# Patient Record
Sex: Female | Born: 1955 | Race: Black or African American | Hispanic: No | Marital: Single | Smoking: Former smoker
Health system: Southern US, Community
[De-identification: ages and names within clinical notes are randomized; demographics above are authoritative.]

## PROBLEM LIST (undated history)

## (undated) DIAGNOSIS — I10 Essential (primary) hypertension: Secondary | ICD-10-CM

## (undated) DIAGNOSIS — F419 Anxiety disorder, unspecified: Secondary | ICD-10-CM

## (undated) DIAGNOSIS — E785 Hyperlipidemia, unspecified: Secondary | ICD-10-CM

## (undated) DIAGNOSIS — E876 Hypokalemia: Secondary | ICD-10-CM

## (undated) DIAGNOSIS — E119 Type 2 diabetes mellitus without complications: Secondary | ICD-10-CM

## (undated) DIAGNOSIS — I4891 Unspecified atrial fibrillation: Secondary | ICD-10-CM

## (undated) DIAGNOSIS — D649 Anemia, unspecified: Secondary | ICD-10-CM

## (undated) DIAGNOSIS — R011 Cardiac murmur, unspecified: Secondary | ICD-10-CM

## (undated) DIAGNOSIS — E039 Hypothyroidism, unspecified: Secondary | ICD-10-CM

## (undated) HISTORY — DX: Hyperlipidemia, unspecified: E78.5

## (undated) HISTORY — DX: Hypothyroidism, unspecified: E03.9

## (undated) HISTORY — DX: Unspecified atrial fibrillation: I48.91

## (undated) HISTORY — DX: Anemia, unspecified: D64.9

## (undated) HISTORY — DX: Essential (primary) hypertension: I10

## (undated) HISTORY — DX: Type 2 diabetes mellitus without complications: E11.9

## (undated) HISTORY — PX: EYE SURGERY: SHX253

---

## 2011-01-11 ENCOUNTER — Other Ambulatory Visit: Payer: Self-pay | Admitting: Family Medicine

## 2011-01-11 DIAGNOSIS — N6320 Unspecified lump in the left breast, unspecified quadrant: Secondary | ICD-10-CM

## 2011-01-19 ENCOUNTER — Ambulatory Visit
Admission: RE | Admit: 2011-01-19 | Discharge: 2011-01-19 | Disposition: A | Discharge status: Home / Self Care | Payer: BC Managed Care – PPO | Source: Ambulatory Visit | Attending: Family Medicine | Admitting: Family Medicine

## 2011-01-19 ENCOUNTER — Other Ambulatory Visit: Payer: Self-pay | Admitting: Diagnostic Radiology

## 2011-01-19 ENCOUNTER — Other Ambulatory Visit: Payer: Self-pay | Admitting: Family Medicine

## 2011-01-19 DIAGNOSIS — N6320 Unspecified lump in the left breast, unspecified quadrant: Secondary | ICD-10-CM

## 2011-02-15 ENCOUNTER — Emergency Department (HOSPITAL_COMMUNITY)
Admission: EM | Admit: 2011-02-15 | Discharge: 2011-02-15 | Disposition: A | Discharge status: Home / Self Care | Payer: No Typology Code available for payment source | Attending: Emergency Medicine | Admitting: Emergency Medicine

## 2011-02-15 DIAGNOSIS — E119 Type 2 diabetes mellitus without complications: Secondary | ICD-10-CM | POA: Insufficient documentation

## 2011-02-15 DIAGNOSIS — S335XXA Sprain of ligaments of lumbar spine, initial encounter: Secondary | ICD-10-CM | POA: Insufficient documentation

## 2011-02-15 DIAGNOSIS — Z79899 Other long term (current) drug therapy: Secondary | ICD-10-CM | POA: Insufficient documentation

## 2011-02-15 DIAGNOSIS — I10 Essential (primary) hypertension: Secondary | ICD-10-CM | POA: Insufficient documentation

## 2011-02-15 DIAGNOSIS — R51 Headache: Secondary | ICD-10-CM | POA: Insufficient documentation

## 2011-07-11 ENCOUNTER — Encounter: Payer: Self-pay | Admitting: Cardiology

## 2011-12-29 ENCOUNTER — Emergency Department (HOSPITAL_COMMUNITY): Payer: BC Managed Care – PPO

## 2011-12-29 ENCOUNTER — Encounter (HOSPITAL_COMMUNITY): Payer: Self-pay | Admitting: *Deleted

## 2011-12-29 ENCOUNTER — Observation Stay (HOSPITAL_COMMUNITY)
Admission: EM | Admit: 2011-12-29 | Discharge: 2011-12-30 | DRG: 139 | Disposition: A | Discharge status: Home / Self Care | Payer: BC Managed Care – PPO | Attending: Internal Medicine | Admitting: Internal Medicine

## 2011-12-29 DIAGNOSIS — E119 Type 2 diabetes mellitus without complications: Secondary | ICD-10-CM

## 2011-12-29 DIAGNOSIS — E032 Hypothyroidism due to medicaments and other exogenous substances: Secondary | ICD-10-CM

## 2011-12-29 DIAGNOSIS — E7800 Pure hypercholesterolemia, unspecified: Secondary | ICD-10-CM | POA: Diagnosis present

## 2011-12-29 DIAGNOSIS — I480 Paroxysmal atrial fibrillation: Secondary | ICD-10-CM | POA: Diagnosis present

## 2011-12-29 DIAGNOSIS — Z794 Long term (current) use of insulin: Secondary | ICD-10-CM

## 2011-12-29 DIAGNOSIS — I4891 Unspecified atrial fibrillation: Principal | ICD-10-CM | POA: Diagnosis present

## 2011-12-29 DIAGNOSIS — I10 Essential (primary) hypertension: Secondary | ICD-10-CM | POA: Diagnosis present

## 2011-12-29 DIAGNOSIS — Z23 Encounter for immunization: Secondary | ICD-10-CM

## 2011-12-29 DIAGNOSIS — E039 Hypothyroidism, unspecified: Secondary | ICD-10-CM | POA: Diagnosis present

## 2011-12-29 DIAGNOSIS — Z7982 Long term (current) use of aspirin: Secondary | ICD-10-CM

## 2011-12-29 DIAGNOSIS — Z79899 Other long term (current) drug therapy: Secondary | ICD-10-CM

## 2011-12-29 DIAGNOSIS — Z87891 Personal history of nicotine dependence: Secondary | ICD-10-CM

## 2011-12-29 DIAGNOSIS — E785 Hyperlipidemia, unspecified: Secondary | ICD-10-CM | POA: Diagnosis present

## 2011-12-29 DIAGNOSIS — F411 Generalized anxiety disorder: Secondary | ICD-10-CM | POA: Diagnosis present

## 2011-12-29 DIAGNOSIS — R011 Cardiac murmur, unspecified: Secondary | ICD-10-CM | POA: Diagnosis present

## 2011-12-29 HISTORY — DX: Cardiac murmur, unspecified: R01.1

## 2011-12-29 HISTORY — DX: Anxiety disorder, unspecified: F41.9

## 2011-12-29 HISTORY — DX: Hypokalemia: E87.6

## 2011-12-29 LAB — HEMOGLOBIN A1C
Hgb A1c MFr Bld: 6.5 % — ABNORMAL HIGH (ref <5.7)
Mean Plasma Glucose: 140 mg/dL — ABNORMAL HIGH (ref <117)

## 2011-12-29 LAB — BASIC METABOLIC PANEL
BUN: 10 mg/dL (ref 6–23)
CO2: 24 mEq/L (ref 19–32)
Calcium: 9.6 mg/dL (ref 8.4–10.5)
Chloride: 105 mEq/L (ref 96–112)
Creatinine, Ser: 0.66 mg/dL (ref 0.50–1.10)
GFR calc Af Amer: >90 mL/min (ref >90)
GFR calc non Af Amer: >90 mL/min (ref >90)
Glucose, Bld: 228 mg/dL — ABNORMAL HIGH (ref 70–99)
Potassium: 4.2 mEq/L (ref 3.5–5.1)
Sodium: 142 mEq/L (ref 135–145)

## 2011-12-29 LAB — POCT I-STAT, CHEM 8
BUN: 10 mg/dL (ref 6–23)
Calcium, Ion: 1.23 mmol/L (ref 1.12–1.32)
Chloride: 101 mEq/L (ref 96–112)
Creatinine, Ser: 0.7 mg/dL (ref 0.50–1.10)
Glucose, Bld: 189 mg/dL — ABNORMAL HIGH (ref 70–99)
HCT: 44 % (ref 36.0–46.0)
Hemoglobin: 15 g/dL (ref 12.0–15.0)
Potassium: 3.3 mEq/L — ABNORMAL LOW (ref 3.5–5.1)
Sodium: 143 mEq/L (ref 135–145)
TCO2: 25 mmol/L (ref 0–100)

## 2011-12-29 LAB — GLUCOSE, CAPILLARY
Glucose-Capillary: 222 mg/dL — ABNORMAL HIGH (ref 70–99)
Glucose-Capillary: 182 mg/dL — ABNORMAL HIGH (ref 70–99)
Glucose-Capillary: 164 mg/dL — ABNORMAL HIGH (ref 70–99)
Glucose-Capillary: 115 mg/dL — ABNORMAL HIGH (ref 70–99)
Glucose-Capillary: 140 mg/dL — ABNORMAL HIGH (ref 70–99)

## 2011-12-29 LAB — POCT I-STAT TROPONIN I: Troponin i, poc: 0 ng/mL (ref 0.00–0.08)

## 2011-12-29 LAB — CBC
HCT: 40.3 % (ref 36.0–46.0)
Hemoglobin: 13.2 g/dL (ref 12.0–15.0)
MCH: 27.2 pg (ref 26.0–34.0)
MCHC: 32.8 g/dL (ref 30.0–36.0)
MCV: 82.9 fL (ref 78.0–100.0)
Platelets: 267 10*3/uL (ref 150–400)
RBC: 4.86 MIL/uL (ref 3.87–5.11)
RDW: 15.2 % (ref 11.5–15.5)
WBC: 7.6 10*3/uL (ref 4.0–10.5)

## 2011-12-29 LAB — PROTIME-INR
INR: 0.83 (ref 0.00–1.49)
Prothrombin Time: 11.6 seconds (ref 11.6–15.2)

## 2011-12-29 LAB — LIPID PANEL
Cholesterol: 115 mg/dL (ref 0–200)
HDL: 33 mg/dL — ABNORMAL LOW (ref >39)
LDL Cholesterol: 58 mg/dL (ref 0–99)
Total CHOL/HDL Ratio: 3.5 RATIO
Triglycerides: 118 mg/dL (ref <150)
VLDL: 24 mg/dL (ref 0–40)

## 2011-12-29 LAB — APTT: aPTT: 31 seconds (ref 24–37)

## 2011-12-29 LAB — CARDIAC PANEL(CRET KIN+CKTOT+MB+TROPI)
CK, MB: 1.7 ng/mL (ref 0.3–4.0)
Relative Index: INVALID (ref 0.0–2.5)
Total CK: 79 U/L (ref 7–177)
Troponin I: <0.3 ng/mL (ref <0.30)

## 2011-12-29 LAB — MRSA PCR SCREENING: MRSA by PCR: NEGATIVE

## 2011-12-29 LAB — TSH: TSH: 5.833 u[IU]/mL — ABNORMAL HIGH (ref 0.350–4.500)

## 2011-12-29 MED ORDER — METOPROLOL SUCCINATE ER 100 MG PO TB24
200.0000 mg | ORAL_TABLET | Freq: Every day | ORAL | Status: DC
Start: 2011-12-29 — End: 2011-12-30
  Administered 2011-12-29 – 2011-12-30 (×2): 200 mg via ORAL
  Filled 2011-12-29 (×2): qty 2

## 2011-12-29 MED ORDER — INSULIN ASPART 100 UNIT/ML ~~LOC~~ SOLN: 20.0000 [IU] | Freq: Two times a day (BID) | SUBCUTANEOUS | Status: DC

## 2011-12-29 MED ORDER — DILTIAZEM HCL 50 MG/10ML IV SOLN: 10.0000 mg | Freq: Once | INTRAVENOUS | Status: DC

## 2011-12-29 MED ORDER — OLMESARTAN-AMLODIPINE-HCTZ 40-5-12.5 MG PO TABS
1.0000 | ORAL_TABLET | ORAL | Status: DC
  Filled 2011-12-29 (×2): qty 1

## 2011-12-29 MED ORDER — DILTIAZEM HCL ER COATED BEADS 240 MG PO CP24
240.0000 mg | ORAL_CAPSULE | Freq: Every day | ORAL | Status: DC
  Administered 2011-12-30: 240 mg via ORAL
  Filled 2011-12-29: qty 1

## 2011-12-29 MED ORDER — DILTIAZEM LOAD VIA INFUSION
10.0000 mg | Freq: Once | INTRAVENOUS | Status: AC
  Administered 2011-12-29: 10 mg via INTRAVENOUS
  Filled 2011-12-29: qty 10

## 2011-12-29 MED ORDER — PNEUMOCOCCAL VAC POLYVALENT 25 MCG/0.5ML IJ INJ
0.5000 mL | INJECTION | INTRAMUSCULAR | Status: AC
  Administered 2011-12-30: 0.5 mL via INTRAMUSCULAR
  Filled 2011-12-29: qty 0.5

## 2011-12-29 MED ORDER — LEVOTHYROXINE SODIUM 50 MCG PO TABS
50.0000 ug | ORAL_TABLET | Freq: Every day | ORAL | Status: DC
  Administered 2011-12-29 – 2011-12-30 (×2): 50 ug via ORAL
  Filled 2011-12-29 (×2): qty 1

## 2011-12-29 MED ORDER — DILTIAZEM HCL 100 MG IV SOLR: 5.0000 mg/h | INTRAVENOUS | Status: DC

## 2011-12-29 MED ORDER — MAGNESIUM SULFATE IN D5W 10-5 MG/ML-% IV SOLN
1.0000 g | Freq: Once | INTRAVENOUS | Status: AC
  Administered 2011-12-29: 1 g via INTRAVENOUS
  Filled 2011-12-29 (×2): qty 100

## 2011-12-29 MED ORDER — MAGNESIUM SULFATE 50 % IJ SOLN: 1.0000 g | Freq: Once | INTRAMUSCULAR | Status: DC

## 2011-12-29 MED ORDER — INSULIN ASPART PROT & ASPART (70-30 MIX) 100 UNIT/ML ~~LOC~~ SUSP: 12.0000 [IU] | Freq: Two times a day (BID) | SUBCUTANEOUS | Status: DC

## 2011-12-29 MED ORDER — HYDRALAZINE HCL 50 MG PO TABS
100.0000 mg | ORAL_TABLET | Freq: Two times a day (BID) | ORAL | Status: DC
Start: 2011-12-29 — End: 2011-12-30
  Administered 2011-12-29 – 2011-12-30 (×3): 100 mg via ORAL
  Filled 2011-12-29 (×4): qty 2

## 2011-12-29 MED ORDER — SODIUM CHLORIDE 0.9 % IV SOLN: INTRAVENOUS | Status: DC

## 2011-12-29 MED ORDER — POLYETHYLENE GLYCOL 3350 17 G PO PACK
17.0000 g | PACK | Freq: Every day | ORAL | Status: DC
  Filled 2011-12-29 (×2): qty 1

## 2011-12-29 MED ORDER — INSULIN ASPART 100 UNIT/ML ~~LOC~~ SOLN
0.0000 [IU] | Freq: Three times a day (TID) | SUBCUTANEOUS | Status: DC
  Administered 2011-12-29 – 2011-12-30 (×3): 3 [IU] via SUBCUTANEOUS
  Administered 2011-12-30: 5 [IU] via SUBCUTANEOUS
  Administered 2011-12-30: 3 [IU] via SUBCUTANEOUS

## 2011-12-29 MED ORDER — ACETAMINOPHEN 650 MG RE SUPP: 650.0000 mg | Freq: Four times a day (QID) | RECTAL | Status: DC | PRN

## 2011-12-29 MED ORDER — DILTIAZEM HCL 100 MG IV SOLR
5.0000 mg/h | INTRAVENOUS | Status: DC
  Administered 2011-12-29: 5 mg/h via INTRAVENOUS
  Filled 2011-12-29: qty 100

## 2011-12-29 MED ORDER — DILTIAZEM HCL ER COATED BEADS 120 MG PO CP24
120.0000 mg | ORAL_CAPSULE | Freq: Every day | ORAL | Status: DC
  Administered 2011-12-29: 120 mg via ORAL
  Filled 2011-12-29: qty 1

## 2011-12-29 MED ORDER — INSULIN ASPART 100 UNIT/ML ~~LOC~~ SOLN: 0.0000 [IU] | Freq: Every day | SUBCUTANEOUS | Status: DC

## 2011-12-29 MED ORDER — SODIUM CHLORIDE 0.9 % IV SOLN
INTRAVENOUS | Status: DC
  Administered 2011-12-29 (×2): via INTRAVENOUS

## 2011-12-29 MED ORDER — INSULIN ASPART 100 UNIT/ML ~~LOC~~ SOLN: 0.0000 [IU] | Freq: Three times a day (TID) | SUBCUTANEOUS | Status: DC

## 2011-12-29 MED ORDER — AMLODIPINE BESYLATE 5 MG PO TABS
5.0000 mg | ORAL_TABLET | Freq: Every day | ORAL | Status: DC
  Filled 2011-12-29: qty 1

## 2011-12-29 MED ORDER — ACETAMINOPHEN 325 MG PO TABS: 650.0000 mg | ORAL_TABLET | Freq: Four times a day (QID) | ORAL | Status: DC | PRN

## 2011-12-29 MED ORDER — ONDANSETRON HCL 4 MG/2ML IJ SOLN: 4.0000 mg | Freq: Four times a day (QID) | INTRAMUSCULAR | Status: DC | PRN

## 2011-12-29 MED ORDER — POTASSIUM CHLORIDE CRYS ER 20 MEQ PO TBCR
40.0000 meq | EXTENDED_RELEASE_TABLET | Freq: Once | ORAL | Status: AC
  Administered 2011-12-29: 40 meq via ORAL
  Filled 2011-12-29: qty 2

## 2011-12-29 MED ORDER — ONDANSETRON HCL 4 MG PO TABS: 4.0000 mg | ORAL_TABLET | Freq: Four times a day (QID) | ORAL | Status: DC | PRN

## 2011-12-29 MED ORDER — OLMESARTAN MEDOXOMIL 40 MG PO TABS
40.0000 mg | ORAL_TABLET | Freq: Every day | ORAL | Status: DC
  Administered 2011-12-29 – 2011-12-30 (×2): 40 mg via ORAL
  Filled 2011-12-29 (×2): qty 1

## 2011-12-29 MED ORDER — SODIUM CHLORIDE 0.9 % IV SOLN
INTRAVENOUS | Status: DC
  Administered 2011-12-29: 07:00:00 via INTRAVENOUS

## 2011-12-29 MED ORDER — DILTIAZEM HCL 100 MG IV SOLR
5.0000 mg/h | INTRAVENOUS | Status: DC
  Filled 2011-12-29: qty 100

## 2011-12-29 MED ORDER — POTASSIUM CHLORIDE CRYS ER 20 MEQ PO TBCR
40.0000 meq | EXTENDED_RELEASE_TABLET | Freq: Once | ORAL | Status: AC
  Administered 2011-12-29: 40 meq via ORAL

## 2011-12-29 MED ORDER — APIXABAN 5 MG PO TABS
5.0000 mg | ORAL_TABLET | Freq: Two times a day (BID) | ORAL | Status: DC
  Administered 2011-12-29 – 2011-12-30 (×3): 5 mg via ORAL
  Filled 2011-12-29 (×5): qty 1

## 2011-12-29 MED ORDER — ZOLPIDEM TARTRATE 5 MG PO TABS: 5.0000 mg | ORAL_TABLET | Freq: Every evening | ORAL | Status: DC | PRN

## 2011-12-29 MED ORDER — ONDANSETRON HCL 4 MG/2ML IJ SOLN: 4.0000 mg | Freq: Three times a day (TID) | INTRAMUSCULAR | Status: AC | PRN

## 2011-12-29 MED ORDER — POTASSIUM CHLORIDE CRYS ER 20 MEQ PO TBCR
EXTENDED_RELEASE_TABLET | ORAL | Status: AC
  Filled 2011-12-29: qty 1

## 2011-12-29 MED ORDER — ASPIRIN EC 81 MG PO TBEC
81.0000 mg | DELAYED_RELEASE_TABLET | Freq: Every day | ORAL | Status: DC
  Filled 2011-12-29: qty 1

## 2011-12-29 MED ORDER — HYDROCHLOROTHIAZIDE 12.5 MG PO CAPS
12.5000 mg | ORAL_CAPSULE | Freq: Every day | ORAL | Status: DC
  Administered 2011-12-29 – 2011-12-30 (×2): 12.5 mg via ORAL
  Filled 2011-12-29 (×2): qty 1

## 2011-12-29 MED ORDER — POTASSIUM CHLORIDE 20 MEQ PO PACK
40.0000 meq | PACK | Freq: Once | ORAL | Status: DC
  Filled 2011-12-29: qty 2

## 2011-12-29 MED ORDER — ENOXAPARIN SODIUM 40 MG/0.4ML ~~LOC~~ SOLN
40.0000 mg | SUBCUTANEOUS | Status: DC
  Administered 2011-12-29: 40 mg via SUBCUTANEOUS
  Filled 2011-12-29: qty 0.4

## 2011-12-29 MED ORDER — FAMOTIDINE 20 MG PO TABS
20.0000 mg | ORAL_TABLET | Freq: Two times a day (BID) | ORAL | Status: DC
  Administered 2011-12-29 – 2011-12-30 (×3): 20 mg via ORAL
  Filled 2011-12-29 (×4): qty 1

## 2011-12-29 MED ORDER — ASPIRIN 81 MG PO CHEW
81.0000 mg | CHEWABLE_TABLET | Freq: Every day | ORAL | Status: DC
  Administered 2011-12-29: 81 mg via ORAL
  Filled 2011-12-29: qty 1

## 2011-12-29 MED ORDER — INSULIN NPH (HUMAN) (ISOPHANE) 100 UNIT/ML ~~LOC~~ SUSP
10.0000 [IU] | Freq: Two times a day (BID) | SUBCUTANEOUS | Status: DC
  Administered 2011-12-29 – 2011-12-30 (×4): 10 [IU] via SUBCUTANEOUS
  Filled 2011-12-29: qty 10

## 2011-12-29 NOTE — Progress Notes (Signed)
[facility]  Pt evaluated. She states she is queasy.  Discussed need for anticoagulation and change of Norvasc to Cardizem. Explained that I will be requesting a formal cardiology eval and an ECHO.   K+ replaced and improved.  Lipids normal except for low HDL.  Cut back on [place] acting Insulin due to decreased PO intake.  Order Free T4. Elevated TSH can be sick euthyroid.   Calvert Cantor, MD [phone]

## 2011-12-29 NOTE — ED Provider Notes (Signed)
History     CSN: [id number]  Arrival date & time 12/29/11  0136   First MD Initiated Contact with Patient 12/29/11 0157      Chief Complaint  Patient presents with  . Palpitations    (Consider location/radiation/quality/duration/timing/severity/associated sxs/prior treatment) HPI Comments: Has a history of similar episode in the past. Is on metoprolol. Was admitted to a hospital in [place] for a similar problem in the past. Primary physician is with [facility] physicians  Patient is a 56 y.o. female presenting with palpitations. The history is provided by the patient. No language interpreter was used.  Palpitations  This is a new problem. The current episode started 3 to 5 hours ago. The problem occurs constantly. The problem has been gradually worsening. The problem is associated with an unknown factor. Associated symptoms include irregular heartbeat. Pertinent negatives include no diaphoresis, no fever, no malaise/fatigue, no numbness, no chest pain, no chest pressure, no exertional chest pressure, no near-syncope, no abdominal pain, no nausea, no vomiting, no headaches, no back pain, no leg pain, no lower extremity edema, no dizziness, no weakness, no cough and no shortness of breath.    Past Medical History  Diagnosis Date  . Hypertension   . Diabetes mellitus   . Hypercholesterolemia   . Thyroid disease   . Anxiety   . Heart murmur   . Hypokalemia     Past Surgical History  Procedure Date  . Eye surgery     History reviewed. No pertinent family history.  History  Substance Use Topics  . Smoking status: Former Games developer  . Smokeless tobacco: Not on file  . Alcohol Use: No    OB History    Grav Para Term Preterm Abortions TAB SAB Ect Mult Living                  Review of Systems  Constitutional: Negative for fever, chills, malaise/fatigue, diaphoresis, activity change, appetite change and fatigue.  HENT: Negative for congestion, sore throat, rhinorrhea, neck pain  and neck stiffness.   Respiratory: Negative for cough, chest tightness and shortness of breath.   Cardiovascular: Positive for palpitations. Negative for chest pain and near-syncope.  Gastrointestinal: Negative for nausea, vomiting, abdominal pain and diarrhea.  Genitourinary: Negative for dysuria, urgency, frequency and flank pain.  Musculoskeletal: Negative for myalgias, back pain and arthralgias.  Neurological: Negative for dizziness, weakness, light-headedness, numbness and headaches.  All other systems reviewed and are negative.    Allergies  Codeine phosphate  Home Medications   Current Outpatient Rx  Name Route Sig Dispense Refill  . ASPIRIN 81 MG PO CHEW Oral Chew 81 mg by mouth daily.    . OMEGA-3 FATTY ACIDS 1000 MG PO CAPS Oral Take 1 g by mouth daily.    Marland Kitchen HYDRALAZINE HCL 100 MG PO TABS Oral Take 100 mg by mouth 2 (two) times daily.    . INSULIN LISPRO PROT & LISPRO (75-25) 100 UNIT/ML [place] SUSP Subcutaneous Inject 20 Units into the skin 2 (two) times daily with a meal.    . LEVOTHYROXINE SODIUM 50 MCG PO TABS Oral Take 50 mcg by mouth daily.    Marland Kitchen METFORMIN HCL 500 MG PO TABS Oral Take 1,000 mg by mouth 2 (two) times daily with a meal.    . METOPROLOL SUCCINATE ER 200 MG PO TB24 Oral Take 200 mg by mouth daily.    Marya Landry PO Oral Take 1 tablet by mouth at bedtime.      BP 124/81  Pulse 71  Temp 98.4 F (36.9 C) (Oral)  Resp 12  Ht 5\' 8"  (1.727 m)  Wt 180 lb (81.647 kg)  BMI 27.37 kg/m2  SpO2 96%  LMP 01/07/2011  Physical Exam  Nursing note and vitals reviewed. Constitutional: She is oriented to person, place, and time. She appears well-developed and well-nourished. No distress.  HENT:  Head: Normocephalic and atraumatic.  Mouth/Throat: Oropharynx is clear and moist.  Eyes: Conjunctivae and EOM are normal. Pupils are equal, round, and reactive to light.  Neck: Normal range of motion. Neck supple.  Cardiovascular: Normal heart sounds and intact distal  pulses.  Exam reveals no gallop and no friction rub.   No murmur heard.      Tachycardic rate and irreg irreg rhythm  Pulmonary/Chest: Effort normal and breath sounds normal. No respiratory distress. She exhibits no tenderness.  Abdominal: Soft. Bowel sounds are normal. There is no tenderness. There is no rebound and no guarding.  Musculoskeletal: Normal range of motion. She exhibits no edema and no tenderness.  Neurological: She is alert and oriented to person, place, and time. No cranial nerve deficit.  Skin: Skin is warm and dry. No rash noted.    ED Course  Procedures (including critical care time)  CRITICAL CARE Performed by: Dayton Bailiff   Total critical care time: 30 min  Critical care time was exclusive of separately billable procedures and treating other patients.  Critical care was necessary to treat or prevent imminent or life-threatening deterioration.  Critical care was time spent personally by me on the following activities: development of treatment plan with patient and/or surrogate as well as nursing, discussions with consultants, evaluation of patient's response to treatment, examination of patient, obtaining history from patient or surrogate, ordering and performing treatments and interventions, ordering and review of laboratory studies, ordering and review of radiographic studies, pulse oximetry and re-evaluation of patient's condition.   Date: 12/29/2011  Rate: 131  Rhythm: atrial fibrillation  QRS Axis: normal  Intervals: normal  ST/T Wave abnormalities: normal  Conduction Disutrbances:none  Narrative Interpretation:   Old EKG Reviewed: none available  Labs Reviewed  POCT I-STAT, CHEM 8 - Abnormal; Notable for the following:    Potassium 3.3 (*)     Glucose, Bld 189 (*)     All other components within normal limits  CBC  APTT  PROTIME-INR  POCT I-STAT TROPONIN I  TSH   No results found.   1. Atrial fibrillation with RVR       MDM  Atrial  fibrillation with RVR and mild hypokalemia. Replaced. Placed on a Cardizem drip which followed a bolus. She improvement of her heart rate. Will be admitted for further evaluation and treatment.        Dayton Bailiff, MD 12/29/11 [phone]

## 2011-12-29 NOTE — H&P (Signed)
Alison Porter is an 56 y.o. female.   Chief Complaint: Palpitations HPI: 55 year-old female with history of diabetes and hypertension among other things who also has had a previous history of heart murmur and palpitation once she was in [facility]. Patient presented today with sudden onset of palpitations which has persisted. No associated chest pain or shortness of breath but she felt slightly dizzy. Denied any fever no cough. No hematemesis no hemoptysis. Patient was found to be in atrial fibrillation with rapid ventricular response with a rate of up to 140s. She denied taking excessive caffeine. Has some thyroid disease but hypothyroidism per patient. She did not take any excess levothyroxine. There is no use a lot of caffeinated drinks. She was placed on metoprolol after she had a similar episode a while back. She is currently been started on IV Cardizem in the emergency room.  Past Medical History  Diagnosis Date  . Hypertension   . Diabetes mellitus   . Hypercholesterolemia   . Thyroid disease   . Anxiety   . Heart murmur   . Hypokalemia     Past Surgical History  Procedure Date  . Eye surgery     History reviewed. No pertinent family history. Social History:  reports that she has quit smoking. She does not have any smokeless tobacco history on file. She reports that she does not drink alcohol or use illicit drugs.  Allergies:  Allergies  Allergen Reactions  . Codeine Phosphate Nausea Only    Medications Prior to Admission  Medication Sig Dispense Refill  . aspirin 81 MG chewable tablet Chew 81 mg by mouth daily.      . fish oil-omega-3 fatty acids 1000 MG capsule Take 1 g by mouth daily.      . hydrALAZINE (APRESOLINE) 100 MG tablet Take 100 mg by mouth 2 (two) times daily.      . insulin lispro protamine-insulin lispro (HUMALOG 75/25) (75-25) 100 UNIT/ML SUSP Inject 20 Units into the skin 2 (two) times daily with a meal.      . levothyroxine (SYNTHROID,  LEVOTHROID) 50 MCG tablet Take 50 mcg by mouth daily.      . metFORMIN (GLUCOPHAGE) 500 MG tablet Take 1,000 mg by mouth 2 (two) times daily with a meal.      . metoprolol (TOPROL-XL) 200 MG 24 hr tablet Take 200 mg by mouth daily.      . Olmesartan-Amlodipine-HCTZ (TRIBENZOR PO) Take 1 tablet by mouth at bedtime.        Results for orders placed during the hospital encounter of 12/29/11 (from the past 48 hour(s))  CBC     Status: Normal   Collection Time   12/29/11  2:15 AM      Component Value Range Comment   WBC 7.6  4.0 - 10.5 K/uL    RBC 4.86  3.87 - 5.11 MIL/uL    Hemoglobin 13.2  12.0 - 15.0 g/dL    HCT 16.1  09.6 - 04.5 %    MCV 82.9  78.0 - 100.0 fL    MCH 27.2  26.0 - 34.0 pg    MCHC 32.8  30.0 - 36.0 g/dL    RDW 40.9  81.1 - 91.4 %    Platelets 267  150 - 400 K/uL   APTT     Status: Normal   Collection Time   12/29/11  2:15 AM      Component Value Range Comment   aPTT 31  24 - 37  seconds   PROTIME-INR     Status: Normal   Collection Time   12/29/11  2:15 AM      Component Value Range Comment   Prothrombin Time 11.6  11.6 - 15.2 seconds    INR 0.83  0.00 - 1.49   POCT I-STAT, CHEM 8     Status: Abnormal   Collection Time   12/29/11  2:26 AM      Component Value Range Comment   Sodium 143  135 - 145 mEq/L    Potassium 3.3 (*) 3.5 - 5.1 mEq/L    Chloride 101  96 - 112 mEq/L    BUN 10  6 - 23 mg/dL    Creatinine, Ser 1.61  0.50 - 1.10 mg/dL    Glucose, Bld 096 (*) 70 - 99 mg/dL    Calcium, Ion 0.45  4.09 - 1.32 mmol/L    TCO2 25  0 - 100 mmol/L    Hemoglobin 15.0  12.0 - 15.0 g/dL    HCT 81.1  91.4 - 78.2 %   POCT I-STAT TROPONIN I     Status: Normal   Collection Time   12/29/11  2:26 AM      Component Value Range Comment   Troponin i, poc 0.00  0.00 - 0.08 ng/mL    Comment 3            GLUCOSE, CAPILLARY     Status: Abnormal   Collection Time   12/29/11  5:03 AM      Component Value Range Comment   Glucose-Capillary 222 (*) 70 - 99 mg/dL    Comment 1 Notify  RN      Dg Chest 2 View  12/29/2011  *RADIOLOGY REPORT*  Clinical Data: Heart palpitations; tachycardia.  CHEST - 2 VIEW  Comparison: None.  Findings: The lungs are well-aerated and clear.  There is no evidence of focal opacification, pleural effusion or pneumothorax.  The heart is normal in size; the mediastinal contour is within normal limits.  No acute osseous abnormalities are seen.  IMPRESSION: No acute cardiopulmonary process seen.  Original Report Authenticated By: Tonia Ghent, M.D.    Review of Systems  Constitutional: Negative.   HENT: Negative.   Eyes: Negative.   Respiratory: Positive for shortness of breath. Negative for cough, hemoptysis and sputum production.   Cardiovascular: Positive for palpitations. Negative for chest pain, orthopnea and PND.  Gastrointestinal: Negative.   Genitourinary: Negative.   Neurological: Negative.   Endo/Heme/Allergies: Negative.   Psychiatric/Behavioral: Negative.     Blood pressure 135/91, pulse 98, temperature 98.2 F (36.8 C), temperature source Oral, resp. rate 13, height 5\' 8"  (1.727 m), weight 81.647 kg (180 lb), last menstrual period 01/07/2011, SpO2 96.00%. Physical Exam  Constitutional: She is oriented to person, place, and time. She appears well-developed and well-nourished.  HENT:  Head: Normocephalic and atraumatic.  Right Ear: External ear normal.  Nose: Nose normal.  Mouth/Throat: Oropharynx is clear and moist.  Eyes: Conjunctivae and EOM are normal. Pupils are equal, round, and reactive to light.  Neck: Normal range of motion. Neck supple.  Cardiovascular: Intact distal pulses.  Tachycardia present.   No murmur heard. Respiratory: Effort normal and breath sounds normal.  GI: Soft. Bowel sounds are normal.  Musculoskeletal: Normal range of motion.  Neurological: She is alert and oriented to person, place, and time. She has normal reflexes.  Skin: Skin is warm and dry.  Psychiatric: She has a normal mood and affect. Her  behavior is  normal. Judgment and thought content normal.     Assessment/Plan 56 year old female presenting with Atrial. fibrillation with rapid ventricular response. Her CHAD2 score is only 2 so she is not a proper candidate for anticoagulation at this point. She is however has rapid ventricular the knees control. Also workup to see what triggers it at this point. Plan #1 A. fib with RVR: Patient will be admitted to step down unit. We'll start IV Cardizem drip to titrate for heart rate less than 110. Will check echocardiogram, TSH, and get cardiology consult in the morning. #2 hypertension: We'll continue with the metoprolol in addition to the Cardizem which controlled her blood pressure effectively. #3 diabetes type 2: We'll continue sliding scale insulin and her home medications. #4 hyperlipidemia: We'll check fasting lipid panel and continue with statin. #5 prophylaxis: Place patient on Lovenox and also add Protonix. Janiyla Vannice,LAWAL 12/29/2011, 5:17 AM

## 2011-12-29 NOTE — Consult Note (Addendum)
CARDIOLOGY CONSULT NOTE  Patient ID: Alison Porter MRN: [id number] DOB/AGE: 12-Mar-1956 56 y.o.  Admit date: 12/29/2011 Reason for Consultation: Atrial fibrillation with RVR  HPI:  56 yo with history of DM, HTN, palpitations, and heart murmur presented to ER last night with atrial fibrillation/RVR.  Late last night, she woke up feeling her heart race.  This had happened in the past multiple times but usually would resolve after 5-10 minutes.  Last night, tachypalpitations persisted.  No chest pain, no dypsnea.  No lightheadedness.  She came to the ER: afib with RVR to 150s.  She was started on a diltiazem gtt and now HR is in the 90s.  She feels better this am with less of a sense of her heart pounding.  She has not had any major cardiac problems in the past.  She had an echo done in [place] at some point because of murmur and was told that she had a leaky valve.  She denies exertional dyspnea or chest pain.   Review of systems complete and found to be negative unless listed above in HPI  Past Medical History: 1. DM 2. HTN 3. Palpitations 4. Hypothyroidism 5. Hyperlipidemia   History   Social History  . Marital Status: Single    Spouse Name: N/A    Number of Children: N/A  . Years of Education: N/A   Occupational History  . Not on file.   Social History Main Topics  . Smoking status: Former Games developer  . Smokeless tobacco: Not on file  . Alcohol Use: No  . Drug Use: No  . Sexually Active:    Other Topics Concern  . Not on file   Social History Narrative  . Out of work.  Lives in [place] with daughter.      Prescriptions prior to admission  Medication Sig Dispense Refill  . aspirin 81 MG chewable tablet Chew 81 mg by mouth daily.      . fish oil-omega-3 fatty acids 1000 MG capsule Take 1 g by mouth daily.      . hydrALAZINE (APRESOLINE) 100 MG tablet Take 100 mg by mouth 2 (two) times daily.      . insulin lispro protamine-insulin lispro (HUMALOG 75/25) (75-25) 100  UNIT/ML SUSP Inject 20 Units into the skin 2 (two) times daily with a meal.      . levothyroxine (SYNTHROID, LEVOTHROID) 50 MCG tablet Take 50 mcg by mouth daily.      . metFORMIN (GLUCOPHAGE) 500 MG tablet Take 1,000 mg by mouth 2 (two) times daily with a meal.      . metoprolol (TOPROL-XL) 200 MG 24 hr tablet Take 200 mg by mouth daily.      . Olmesartan-Amlodipine-HCTZ (TRIBENZOR) 40-10-25 MG TABS Take 1 tablet by mouth at bedtime.       Current Facility-Administered Medications  Medication Dose Route Frequency Provider Last Rate Last Dose  . 0.9 %  sodium chloride infusion   Intravenous STAT Dayton Bailiff, MD      . acetaminophen (TYLENOL) tablet 650 mg  650 mg Oral Q6H PRN Rometta Emery, MD       Or  . acetaminophen (TYLENOL) suppository 650 mg  650 mg Rectal Q6H PRN Rometta Emery, MD      . apixaban (ELIQUIS) tablet 5 mg  5 mg Oral BID Laurey Morale, MD      . aspirin chewable tablet 81 mg  81 mg Oral Daily Rometta Emery, MD   [phone]  mg at 12/29/11 1100  . diltiazem (CARDIZEM CD) 24 hr capsule 240 mg  240 mg Oral Daily Laurey Morale, MD      . diltiazem (CARDIZEM) 1 mg/mL load via infusion 10 mg  10 mg Intravenous Once Dayton Bailiff, MD   10 mg at 12/29/11 0230  . diltiazem (CARDIZEM) 100 mg in dextrose 5 % 100 mL infusion  5-15 mg/hr Intravenous Titrated Saima Rizwan, MD      . enoxaparin (LOVENOX) injection 40 mg  40 mg Subcutaneous Q24H Rometta Emery, MD   40 mg at 12/29/11 1100  . famotidine (PEPCID) tablet 20 mg  20 mg Oral BID Calvert Cantor, MD   20 mg at 12/29/11 1100  . hydrALAZINE (APRESOLINE) tablet 100 mg  100 mg Oral BID Rometta Emery, MD   100 mg at 12/29/11 1100  . hydrochlorothiazide (MICROZIDE) capsule 12.5 mg  12.5 mg Oral Daily Calvert Cantor, MD   12.5 mg at 12/29/11 1100  . insulin aspart (novoLOG) injection 0-15 Units  0-15 Units Subcutaneous TID WC Jinger Neighbors, NP   3 Units at 12/29/11 0800  . insulin aspart (novoLOG) injection 0-5 Units  0-5 Units  Subcutaneous QHS Jinger Neighbors, NP      . insulin NPH (HUMULIN N,NOVOLIN N) injection 10 Units  10 Units Subcutaneous BID AC Calvert Cantor, MD   10 Units at 12/29/11 1100  . levothyroxine (SYNTHROID, LEVOTHROID) tablet 50 mcg  50 mcg Oral Daily Rometta Emery, MD   50 mcg at 12/29/11 1100  . metoprolol succinate (TOPROL-XL) 24 hr tablet 200 mg  200 mg Oral Daily Rometta Emery, MD   200 mg at 12/29/11 1100  . olmesartan (BENICAR) tablet 40 mg  40 mg Oral Daily Calvert Cantor, MD   40 mg at 12/29/11 1100  . ondansetron (ZOFRAN) injection 4 mg  4 mg Intravenous Q8H PRN Dayton Bailiff, MD      . ondansetron [facility]) tablet 4 mg  4 mg Oral Q6H PRN Rometta Emery, MD       Or  . ondansetron (ZOFRAN) injection 4 mg  4 mg Intravenous Q6H PRN Rometta Emery, MD      . pneumococcal 23 valent vaccine (PNU-IMMUNE) injection 0.5 mL  0.5 mL Intramuscular Tomorrow-1000 Rometta Emery, MD      . polyethylene glycol (MIRALAX / GLYCOLAX) packet 17 g  17 g Oral Daily Saima Rizwan, MD      . potassium chloride SA (K-DUR,KLOR-CON) CR tablet 40 mEq  40 mEq Oral Once Dayton Bailiff, MD   40 mEq at 12/29/11 0407  . zolpidem (AMBIEN) tablet 5 mg  5 mg Oral QHS PRN Rometta Emery, MD      . DISCONTD: 0.9 %  sodium chloride infusion   Intravenous Continuous Dayton Bailiff, MD 125 mL/hr at 12/29/11 0615    . DISCONTD: 0.9 %  sodium chloride infusion   Intravenous Continuous Rometta Emery, MD 125 mL/hr at 12/29/11 0718    . DISCONTD: amLODipine (NORVASC) tablet 5 mg  5 mg Oral Daily Calvert Cantor, MD      . DISCONTD: aspirin EC tablet 81 mg  81 mg Oral Daily Rometta Emery, MD      . DISCONTD: diltiazem (CARDIZEM CD) 24 hr capsule 120 mg  120 mg Oral Daily Calvert Cantor, MD   120 mg at 12/29/11 1100  . DISCONTD: diltiazem (CARDIZEM) 100 mg in dextrose 5 % 100 mL infusion  5-15 mg/hr  Intravenous Titrated Dayton Bailiff, MD 5 mL/hr at 12/29/11 0500 5 mg/hr at 12/29/11 0500  . DISCONTD: diltiazem (CARDIZEM) 100 mg in dextrose 5  % 100 mL infusion  5-15 mg/hr Intravenous Titrated Rometta Emery, MD      . DISCONTD: diltiazem (CARDIZEM) injection SOLN 10 mg  10 mg Intravenous Once Dayton Bailiff, MD      . DISCONTD: insulin aspart (novoLOG) injection 0-9 Units  0-9 Units Subcutaneous TID WC Rometta Emery, MD      . DISCONTD: insulin aspart (novoLOG) injection 20 Units  20 Units Subcutaneous BID Rometta Emery, MD      . DISCONTD: insulin aspart protamine-insulin aspart (NOVOLOG 70/30) injection 12 Units  12 Units Subcutaneous BID WC Calvert Cantor, MD      . DISCONTD: Olmesartan-Amlodipine-HCTZ 40-5-12.5 MG TABS 1 tablet  1 tablet Oral 1 day or 1 dose Rometta Emery, MD         Physical exam Blood pressure 135/91, pulse 98, temperature 98.8 F (37.1 C), temperature source Oral, resp. rate 13, height 5\' 8"  (1.727 m), weight 81.647 kg (180 lb), last menstrual period 01/07/2011, SpO2 96.00%. General: NAD Neck: JVP 7-8 cm, no thyromegaly or thyroid nodule.  Lungs: Clear to auscultation bilaterally with normal respiratory effort. CV: Nondisplaced PMI.  Heart irregular S1/S2, no S3/S4, 2/6 systolic murmur heart LLSB/apex.  No peripheral edema.  No carotid bruit.  Normal pedal pulses.  Abdomen: Soft, nontender, no hepatosplenomegaly, no distention.  Skin: Intact without lesions or rashes.  Neurologic: Alert and oriented x 3.  Psych: Normal affect. Extremities: No clubbing or cyanosis.  HEENT: Normal.   Labs:   Lab Results  Component Value Date   WBC 7.6 12/29/2011   HGB 15.0 12/29/2011   HCT 44.0 12/29/2011   MCV 82.9 12/29/2011   PLT 267 12/29/2011    Lab 12/29/11 0226  NA 143  K 3.3*  CL 101  CO2 --  BUN 10  CREATININE 0.70  CALCIUM --  PROT --  BILITOT --  ALKPHOS --  ALT --  AST --  GLUCOSE 189*   No results found for this basename: CKTOTAL, CKMB, CKMBINDEX, TROPONINI    Lab Results  Component Value Date   CHOL 115 12/29/2011   Lab Results  Component Value Date   HDL 33* 12/29/2011   Lab  Results  Component Value Date   LDLCALC 58 12/29/2011   Lab Results  Component Value Date   TRIG 118 12/29/2011   Lab Results  Component Value Date   CHOLHDL 3.5 12/29/2011   No results found for this basename: LDLDIRECT      Radiology: CXR with no acute abnormalities. EKG: Atrial fibrillation with RVR in 130s, inferior and anterolateral 1 mm ST depression  ASSESSMENT AND PLAN:  56 yo with HTN, DM presented with atrial fibrillation with RVR and symptomatic tachypalpitations.  1. Atrial fibrillation: New onset, began last night.  HR now controlled in the 90s.  Suspect she may have had episodes in the past given brief tachypalps in the past.  No ETOH.  - Replete K, will give some Mg. - CHADSVASC = 3 (female, HTN, DM), so will anticoagulate.  She has Nurse, learning disability so I will start her on apixaban.  Will have care coordinator tell us if this will be reasonable for her cost-wise.  - Can transition to po diltiazem, will use diltiazem CD 240 mg daily and stop dilt gtt. Can continue home Toprol XL.  - Echo for LV  fxn.  - Check TSH.  - Will monitor, if remains in atrial fibrillation over weekend, can cardiovert on Monday after 5 doses of apixaban (would have to do TEE as apixaban has dips and troughs in degree of anticoagulation until it reaches steady state).  2. Murmur: Possible mitral regurgitation, doubt severe by exam.  As above, will get echo.  3. HTN: BP starting to run higher.  Will monitor, may need titrate meds.  4. ST depression on ECG: Occurred while in afib with RVR.  No chest pain.  1 more set of cardiac enzymes will be done and ECG repeated with rate control.   Marca Ancona 12/29/2011 12:02 PM

## 2011-12-29 NOTE — ED Notes (Signed)
Pt awoke from sleep feeling like her heart was fluttering.  Denies nv, sob or pain.

## 2011-12-30 DIAGNOSIS — E119 Type 2 diabetes mellitus without complications: Secondary | ICD-10-CM

## 2011-12-30 DIAGNOSIS — E032 Hypothyroidism due to medicaments and other exogenous substances: Secondary | ICD-10-CM

## 2011-12-30 DIAGNOSIS — I4891 Unspecified atrial fibrillation: Secondary | ICD-10-CM

## 2011-12-30 DIAGNOSIS — I10 Essential (primary) hypertension: Secondary | ICD-10-CM

## 2011-12-30 LAB — GLUCOSE, CAPILLARY
Glucose-Capillary: 227 mg/dL — ABNORMAL HIGH (ref 70–99)
Glucose-Capillary: 183 mg/dL — ABNORMAL HIGH (ref 70–99)
Glucose-Capillary: 185 mg/dL — ABNORMAL HIGH (ref 70–99)

## 2011-12-30 LAB — T4, FREE: Free T4: 1.23 ng/dL (ref 0.80–1.80)

## 2011-12-30 MED ORDER — DILTIAZEM HCL ER COATED BEADS 240 MG PO CP24: 240.0000 mg | ORAL_CAPSULE | Freq: Every day | ORAL | Status: DC

## 2011-12-30 MED ORDER — APIXABAN 5 MG PO TABS: 5.0000 mg | ORAL_TABLET | Freq: Two times a day (BID) | ORAL | Status: DC

## 2011-12-30 MED ORDER — OFF THE BEAT BOOK
Freq: Once | Status: AC
  Administered 2011-12-30: 09:00:00
  Filled 2011-12-30: qty 1

## 2011-12-30 MED ORDER — LISINOPRIL-HYDROCHLOROTHIAZIDE 20-25 MG PO TABS: 1.0000 | ORAL_TABLET | Freq: Every day | ORAL | Status: DC

## 2011-12-30 MED ORDER — OLMESARTAN MEDOXOMIL-HCTZ 40-25 MG PO TABS: 1.0000 | ORAL_TABLET | Freq: Every day | ORAL | Status: DC

## 2011-12-30 NOTE — Discharge Summary (Addendum)
DISCHARGE SUMMARY  Alison Porter  MR#: [id number]  DOB:1955/10/11  Date of Admission: 12/29/2011 Date of Discharge: 12/30/2011  Attending Physician:Alison Porter  Patient's WUJ:[id number],GNFAO Alison Saucer, MD  Consults: Alison Ancona MD- [facility]  Presenting Complaint: Palpitations and chest pain  Discharge Diagnoses:   *Atrial fibrillation with RVR (ST depressions with RVR) Murmur  HTN (hypertension)  DM (diabetes mellitus)  Hyperlipidemia  Hypothyroidism    Discharge Medications: Medication List  As of 12/30/2011  5:32 PM   STOP taking these medications         TRIBENZOR 40-10-25 MG Tabs         TAKE these medications         apixaban 5 MG Tabs tablet   Commonly known as: ELIQUIS   Take 1 tablet (5 mg total) by mouth 2 (two) times daily.      aspirin 81 MG chewable tablet   Chew 81 mg by mouth daily.      diltiazem 240 MG 24 hr capsule   Commonly known as: CARDIZEM CD   Take 1 capsule (240 mg total) by mouth daily.      fish oil-omega-3 fatty acids 1000 MG capsule   Take 1 g by mouth daily.      hydrALAZINE 100 MG tablet   Commonly known as: APRESOLINE   Take 100 mg by mouth 2 (two) times daily.      insulin lispro protamine-insulin lispro (75-25) 100 UNIT/ML Susp   Commonly known as: HUMALOG 75/25   Inject 20 Units into the skin 2 (two) times daily with a meal.      levothyroxine 50 MCG tablet   Commonly known as: SYNTHROID, LEVOTHROID   Take 50 mcg by mouth daily.      lisinopril-hydrochlorothiazide 20-25 MG per tablet   Commonly known as: PRINZIDE,ZESTORETIC   Take 1 tablet by mouth daily.      metFORMIN 500 MG tablet   Commonly known as: GLUCOPHAGE   Take 1,000 mg by mouth 2 (two) times daily with a meal.      metoprolol 200 MG 24 hr tablet   Commonly known as: TOPROL-XL   Take 200 mg by mouth daily.             Procedures: Dg Chest 2 View  12/29/2011  *RADIOLOGY REPORT*  Clinical Data: Heart palpitations; tachycardia.  CHEST - 2  VIEW  Comparison: None.  Findings: The lungs are well-aerated and clear.  There is no evidence of focal opacification, pleural effusion or pneumothorax.  The heart is normal in size; the mediastinal contour is within normal limits.  No acute osseous abnormalities are seen.  IMPRESSION: No acute cardiopulmonary process seen.  Original Report Authenticated By: Alison Porter, M.D.     Hospital Course: 56 year-old female with history of diabetes and hypertension who also has had a history of heart murmur and palpitation in the past. Patient presented to the ER with sudden onset of palpitations and some chest pain.  Shewas found to be in atrial fibrillation with rapid ventricular response with a rate of up to 140s.   *Atrial fibrillation with RVR Initially started on IV Cardizem and then transitioned to PO cardiazem. She already takes a high dose of Toprol and BP has been in the low 100s but she is tolerating it well. Her chest pain resolve when her HR came down to 120-130 range and has not recurred with exertion.  [facility] has been consulted and are recommending a Myoview, ECHO and cardioversion. She has  been started on Epixaban which she is to be discharged home with.  I have spoken with Dr Alison Porter who evaluated her today. He recommends asking their PA- Alison Porter to set up above appointment which I have done. I have given an explanation to the patient and answered her questions.    Day of Discharge Physical Exam: BP 127/82  Pulse 85  Temp 97.5 F (36.4 C) (Oral)  Resp 18  Ht 5\' 8"  (1.727 m)  Wt 81.647 kg (180 lb)  BMI 27.37 kg/m2  SpO2 95%  LMP 01/07/2011 Gen: awake alert and oriented in no distress Cardiovascular: IRR, no murmur No murmur heard.  Respiratory: Effort normal and breath sounds normal.  GI: Soft. Bowel sounds are normal.  Musculoskeletal: Normal range of motion.    Disposition: stable   Follow-up Appts: Discharge Orders    Future Orders Please Complete By  Expires   Diet - low sodium heart healthy      Increase activity slowly         Follow-up with Dr.Dalton Mclean in 1-2 weeks.  Time on Discharge: >45 min  Signed: Geron Porter 12/30/2011, 5:32 PM

## 2011-12-30 NOTE — Discharge Instructions (Signed)
Atrial Fibrillation  Atrial fibrillation is an abnormal heartbeat (rhythm). It can cause your heart rate to be faster or slower than normal, and can cause clots of blood to form in your heart. These clots can cause other health problems. Atrial fibrillation may be caused by a heart attack, lung problem, or certain medicine. Sometimes the cause of atrial fibrillation is not found.  HOME CARE   Take blood thinning medicine (anticoagulants) as told by your doctor. Your doctor will need to draw your blood to check lab values if you take blood thinners.   If you had a cardioversion, limit your activity as told by your doctor.   Learn how to check your heartbeat (pulse) for an abnormal or irregular beat. Your doctor can show you how.   Ask your doctor if it is okay to exercise.   Only take medicine as told by your doctor.  GET HELP RIGHT AWAY IF:    You have trouble breathing or feel dizzy.   You have puffy (swollen) feet or ankles.   You have blood in your pee (urine) or poop (bowel movement).   You feel your heart "skipping" beats.   You feel your heart "racing" or beating fast.   You have weakness in your arms or legs.   You have trouble talking, seeing, or thinking.   You have chest pain or pain in your arm or jaw.  MAKE SURE YOU:    Understand these instructions.   Will watch your condition.   Will get help right away if you are not doing well or get worse.  Document Released: 04/03/2008 Document Revised: 06/14/2011 Document Reviewed: 10/13/2009  ExitCare Patient Information 2012 [employer].

## 2011-12-30 NOTE — Progress Notes (Signed)
@   Subjective:  Denies CP or dyspnea; palpitations have resolved   Objective:  Filed Vitals:   12/30/11 0300 12/30/11 0400 12/30/11 0724 12/30/11 0725  BP: 107/52  135/71   Pulse: 73 77    Temp: 98.3 F (36.8 C)   98.3 F (36.8 C)  TempSrc: Oral   Oral  Resp: 12 3    Height:      Weight:      SpO2: 96% 95%      Intake/Output from previous day:  Intake/Output Summary (Last 24 hours) at 12/30/11 0915 Last data filed at 12/30/11 0725  Gross per 24 hour  Intake    975 ml  Output   1700 ml  Net   -725 ml    Physical Exam: Physical exam: Well-developed well-nourished in no acute distress.  Skin is warm and dry.  HEENT is normal.  Neck is supple.  Chest is clear to auscultation with normal expansion.  Cardiovascular exam is irregular Abdominal exam nontender or distended. No masses palpated. Extremities show no edema. neuro grossly intact    Lab Results: Basic Metabolic Panel:  Basename 12/29/11 1230 12/29/11 0226  NA 142 143  K 4.2 3.3*  CL 105 101  CO2 24 --  GLUCOSE 228* 189*  BUN 10 10  CREATININE 0.66 0.70  CALCIUM 9.6 --  MG -- --  PHOS -- --   CBC:  Basename 12/29/11 0226 12/29/11 0215  WBC -- 7.6  NEUTROABS -- --  HGB 15.0 13.2  HCT 44.0 40.3  MCV -- 82.9  PLT -- 267   Cardiac Enzymes:  Basename 12/29/11 1209  CKTOTAL 79  CKMB 1.7  CKMBINDEX --  TROPONINI <0.30     Assessment/Plan:  1) PAF - patient remains in atrial fibrillation; HR controlled; continue cardizem and toprol; continue apixaban. If rate controlled with ambulation, could DC and proceed with DCCV in 4 weeks; if rate difficult to control, could pursue TEE guided DCCV. 2) Murmur - echo 3 ST changes - ST depression noted on initial ECG and [place] h/o DM; enzymes neg; needs outpatient functional study. 4) HTN - BP controlled; continue present meds.  Olga Millers 12/30/2011, 9:15 AM

## 2011-12-30 NOTE — Progress Notes (Signed)
Discharge instructions given to patient re:  New medications (Eliquis, Diltiazem), discharge appointment, and information re: atrial fibrillation.  Time afforded to ask pertinent questions.  Pt. Appeared to have good understanding of all information provided AEB appropriate responses to questions.  Pt. Discharged to private home accompanied by daughter via private vehicle.  Escorted to exit via wheelchair by nurse tech.

## 2011-12-30 NOTE — Progress Notes (Signed)
   CARE MANAGEMENT NOTE 12/30/2011  Patient:  Alison Porter, Alison Porter   Account Number:  [id number]  Date Initiated:  12/30/2011  Documentation initiated by:  [facility]  Subjective/Objective Assessment:   Atrial fibrillation with RVR (ST depressions with RVR)     Action/Plan:   Anticipated DC Date:  12/30/2011   Anticipated DC Plan:  HOME/SELF CARE      DC Planning Services  CM consult  Medication Assistance      Choice offered to / List presented to:             Status of service:  Completed, signed off Medicare Important Message given?   (If response is "NO", the following Medicare IM given date fields will be blank) Date Medicare IM given:   Date Additional Medicare IM given:    Discharge Disposition:  HOME/SELF CARE  Per UR Regulation:    If discussed at [facility] Length of Stay Meetings, dates discussed:    Comments:  12/30/2011 1425 Pt states she uses [facility] on [phone]. Received call from MD to follow up on copay for Eliquis. Pt stated she cannot afford an expensive copay. Contacted pharmacy and they verified fax number and will run for benefits. Faxed Rx to [facility] to verify copay for medication. Copay $25.00, pt ok with that cost. Made Dr Butler Denmark aware, pt scheduled for d/c today. Isidoro Donning RN CCM Case Mgmt phone [phone]

## 2011-12-30 NOTE — Progress Notes (Signed)
Pt. Ambulated with nurse approx. 200 ft. Without difficulty or SOB.  HR remained 84-98 (Atrial fib.).  O2 sats 95-99% on R/A.  Dr. Butler Denmark aware.

## 2012-01-09 ENCOUNTER — Ambulatory Visit (HOSPITAL_COMMUNITY): Payer: BC Managed Care – PPO

## 2012-01-14 ENCOUNTER — Other Ambulatory Visit: Payer: Self-pay | Admitting: Family Medicine

## 2012-01-14 DIAGNOSIS — Z1231 Encounter for screening mammogram for malignant neoplasm of breast: Secondary | ICD-10-CM

## 2012-01-15 ENCOUNTER — Other Ambulatory Visit (HOSPITAL_COMMUNITY): Payer: Self-pay | Admitting: Cardiology

## 2012-01-15 DIAGNOSIS — I4891 Unspecified atrial fibrillation: Secondary | ICD-10-CM

## 2012-01-16 ENCOUNTER — Ambulatory Visit (HOSPITAL_COMMUNITY): Payer: BC Managed Care – PPO | Attending: Cardiovascular Disease | Admitting: Radiology

## 2012-01-16 DIAGNOSIS — E785 Hyperlipidemia, unspecified: Secondary | ICD-10-CM | POA: Insufficient documentation

## 2012-01-16 DIAGNOSIS — I10 Essential (primary) hypertension: Secondary | ICD-10-CM | POA: Insufficient documentation

## 2012-01-16 DIAGNOSIS — I059 Rheumatic mitral valve disease, unspecified: Secondary | ICD-10-CM | POA: Insufficient documentation

## 2012-01-16 DIAGNOSIS — E119 Type 2 diabetes mellitus without complications: Secondary | ICD-10-CM | POA: Insufficient documentation

## 2012-01-16 DIAGNOSIS — I517 Cardiomegaly: Secondary | ICD-10-CM | POA: Insufficient documentation

## 2012-01-16 DIAGNOSIS — Z87891 Personal history of nicotine dependence: Secondary | ICD-10-CM | POA: Insufficient documentation

## 2012-01-16 DIAGNOSIS — R011 Cardiac murmur, unspecified: Secondary | ICD-10-CM

## 2012-01-16 DIAGNOSIS — I4891 Unspecified atrial fibrillation: Secondary | ICD-10-CM

## 2012-01-16 NOTE — Progress Notes (Signed)
Echocardiogram performed.  

## 2012-01-21 ENCOUNTER — Telehealth: Payer: Self-pay | Admitting: Cardiology

## 2012-01-21 NOTE — Telephone Encounter (Signed)
Pt was seen at hospital and given Rx for diltiazem 240 qd, eliquis 5mg  bid, lisinopril/hydrochlorothiazide 20mg  /25mg  qd & pt will run on the 23rd and she was wondering did we have samples because will get her check on 25th

## 2012-01-21 NOTE — Telephone Encounter (Signed)
Spoke with pt. Samples of Eliquis 5mg  #28 left at desk for pt to pick-up. We do not have diltiazem or lisinopril/HCTsamples. Pt is aware.

## 2012-01-22 ENCOUNTER — Ambulatory Visit (HOSPITAL_COMMUNITY): Payer: BC Managed Care – PPO | Attending: Cardiovascular Disease | Admitting: Radiology

## 2012-01-22 VITALS — BP 185/64 | Ht 68.0 in | Wt 169.0 lb

## 2012-01-22 DIAGNOSIS — R0602 Shortness of breath: Secondary | ICD-10-CM | POA: Insufficient documentation

## 2012-01-22 DIAGNOSIS — Z87891 Personal history of nicotine dependence: Secondary | ICD-10-CM | POA: Insufficient documentation

## 2012-01-22 DIAGNOSIS — R9431 Abnormal electrocardiogram [ECG] [EKG]: Secondary | ICD-10-CM | POA: Insufficient documentation

## 2012-01-22 DIAGNOSIS — I059 Rheumatic mitral valve disease, unspecified: Secondary | ICD-10-CM | POA: Insufficient documentation

## 2012-01-22 DIAGNOSIS — E785 Hyperlipidemia, unspecified: Secondary | ICD-10-CM | POA: Insufficient documentation

## 2012-01-22 DIAGNOSIS — R42 Dizziness and giddiness: Secondary | ICD-10-CM | POA: Insufficient documentation

## 2012-01-22 DIAGNOSIS — E119 Type 2 diabetes mellitus without complications: Secondary | ICD-10-CM | POA: Insufficient documentation

## 2012-01-22 DIAGNOSIS — I4891 Unspecified atrial fibrillation: Secondary | ICD-10-CM

## 2012-01-22 DIAGNOSIS — R002 Palpitations: Secondary | ICD-10-CM | POA: Insufficient documentation

## 2012-01-22 DIAGNOSIS — I10 Essential (primary) hypertension: Secondary | ICD-10-CM | POA: Insufficient documentation

## 2012-01-22 DIAGNOSIS — Z794 Long term (current) use of insulin: Secondary | ICD-10-CM | POA: Insufficient documentation

## 2012-01-22 DIAGNOSIS — R000 Tachycardia, unspecified: Secondary | ICD-10-CM | POA: Insufficient documentation

## 2012-01-22 MED ORDER — TECHNETIUM TC 99M TETROFOSMIN IV KIT
10.0000 | PACK | Freq: Once | INTRAVENOUS | Status: AC | PRN
  Administered 2012-01-22: 10 via INTRAVENOUS

## 2012-01-22 MED ORDER — TECHNETIUM TC 99M TETROFOSMIN IV KIT
30.0000 | PACK | Freq: Once | INTRAVENOUS | Status: AC | PRN
  Administered 2012-01-22: 30 via INTRAVENOUS

## 2012-01-22 NOTE — Progress Notes (Addendum)
[facility] NUCLEAR MED [address] [place] [address] [phone]  Cardiology Nuclear Med Sarita Bottom  Alison Porter is a 56 y.o. female     MRN : [id number]     DOB: [birth year]  Procedure Date: 01/22/2012  Nuclear Med Background Indication for Stress Test:  Evaluation for Ischemia,12/29/11 Post Hospital: ER with A-Fib with RVR and Abnormal EKG:A-Fib,inferior/anterolateral 1mm ST depression History:  '83 MPS: NL per pt. Alison Porter with RVR 01/16/12 EF: 55-60% mild MVR in [place] leaky valve Cardiac Risk Factors: History of Smoking, Hypertension, IDDM Type 2 and Lipids  Symptoms:  Dizziness, Light-Headedness, Palpitations, Rapid HR and SOB   Nuclear Pre-Procedure Caffeine/Decaff Intake:  None NPO After: 8:30pm   Lungs:  clear O2 Sat: 98% on room air. IV 0.9% NS with Angio Cath:  20g  IV Site: R Antecubital  IV Started by:  Irean Hong, RN  Chest Size (in):  36 Cup Size: B  Height: 5\' 8"  (1.727 m)  Weight:  169 lb (76.658 kg)  BMI:  Body mass index is 25.70 kg/(m^2). Tech Comments:  No Metformin this am. This patient's pictures were checked preliminarily and were found to be ok for the patient to leave. Dr.Greg Ladona Ridgel was consulted further for her abnormal EKG and stated it was RVOT and that for this patient it was unremarkable and to follow up with her cardiologist.     Nuclear Med Study 1 or 2 day study: 1 day  Stress Test Type:  Stress  Reading MD: Charlton Haws, MD  Order Authorizing Provider:  Fransico Meadow  Resting Radionuclide: Technetium 4m Tetrofosmin  Resting Radionuclide Dose: 11.0 mCi   Stress Radionuclide:  Technetium 70m Tetrofosmin  Stress Radionuclide Dose: 33.0 mCi           Stress Protocol Rest HR: 86 Stress HR: 162  Rest BP: 185/64 Stress BP: 195/56  Exercise Time (min): 3:00 METS: 4.60   Predicted Max HR: 166 bpm % Max HR: 97.59 bpm Rate Pressure Product: 78469   Dose of Adenosine (mg):  n/a Dose of Lexiscan: n/a mg    Dose of Atropine (mg): n/a Dose of Dobutamine: n/a mcg/kg/min (at max HR)  Stress Test Technologist: Milana Na, EMT-P  Nuclear Technologist:  Domenic Polite, CNMT     Rest Procedure:  Myocardial perfusion imaging was performed at rest 45 minutes following the intravenous administration of Technetium 46m Tetrofosmin. Rest ECG: NSR - Normal EKG  Stress Procedure:  The patient performed treadmill exercise using a Bruce  Protocol for 3:00 minutes. The patient stopped due to fatigue, sob, and denied any chest pain.  There were + significant ST-T wave changes and occ pvcs.  Technetium 72m Tetrofosmin was injected at peak exercise and myocardial perfusion imaging was performed after a brief delay. Stress ECG: This patient was injected quickly due to getting her heart rate up and then she went into RVOT per G.Ladona Ridgel and then converted back to a sinus rhythm.  QPS Raw Data Images:  Normal; no motion artifact; normal heart/lung ratio. Stress Images:  Normal homogeneous uptake in all areas of the myocardium. Rest Images:  Normal homogeneous uptake in all areas of the myocardium. Subtraction (SDS):  Normal Transient Ischemic Dilatation (Normal <1.22):  1.00 Lung/Heart Ratio (Normal <0.45):  0.32  Quantitative Gated Spect Images QGS EDV:  98 ml QGS ESV:  21 ml  Impression Exercise Capacity:  Fair exercise capacity. BP Response:  Normal blood pressure response. Clinical Symptoms:  Mild chest pain/dyspnea.  ECG Impression:  Patient appeared to have RVOT VT during exercise at rate of 162 BPM.  Broke spontaneously with diffuse T wave changes in recovery Comparison with Prior Nuclear Study: No images to compare  Overall Impression:  Normal nuclear images and EF.  However patient had significant arrhythmia with T wave changes in recovery.  ? RVOT VT Apparently ECG;s showed to Dr Ladona Ridgel and patient allowed to go home.  Discussed with Dr Shirlee Latch who will F/U with patient  LV Ejection Fraction:  78%.  LV Wall Motion:  NL LV Function; NL Wall Motion    Charlton Haws   No ischemia or infarction.  Spoke with patient about possible RVOT tachycardia and discussed with Dr. Graciela Husbands.  She is on diltiazem, echo showed normal RV.  I will see her in the office sooner than planned.   Marca Ancona 01/25/2012 9:40 AM

## 2012-01-24 ENCOUNTER — Encounter: Payer: Self-pay | Admitting: *Deleted

## 2012-01-25 ENCOUNTER — Other Ambulatory Visit: Payer: Self-pay | Admitting: *Deleted

## 2012-01-25 DIAGNOSIS — I472 Ventricular tachycardia, unspecified: Secondary | ICD-10-CM

## 2012-01-25 DIAGNOSIS — R002 Palpitations: Secondary | ICD-10-CM

## 2012-01-29 ENCOUNTER — Encounter (INDEPENDENT_AMBULATORY_CARE_PROVIDER_SITE_OTHER): Payer: BC Managed Care – PPO

## 2012-01-29 ENCOUNTER — Other Ambulatory Visit: Payer: Self-pay | Admitting: *Deleted

## 2012-01-29 DIAGNOSIS — R002 Palpitations: Secondary | ICD-10-CM

## 2012-01-29 DIAGNOSIS — R000 Tachycardia, unspecified: Secondary | ICD-10-CM

## 2012-01-29 DIAGNOSIS — I472 Ventricular tachycardia, unspecified: Secondary | ICD-10-CM

## 2012-01-29 MED ORDER — DILTIAZEM HCL ER COATED BEADS 240 MG PO CP24: 240.0000 mg | ORAL_CAPSULE | Freq: Every day | ORAL | Status: DC

## 2012-01-29 MED ORDER — APIXABAN 5 MG PO TABS: 5.0000 mg | ORAL_TABLET | Freq: Two times a day (BID) | ORAL | Status: DC

## 2012-01-29 MED ORDER — LISINOPRIL-HYDROCHLOROTHIAZIDE 20-25 MG PO TABS: 1.0000 | ORAL_TABLET | Freq: Every day | ORAL | Status: DC

## 2012-01-31 ENCOUNTER — Telehealth: Payer: Self-pay | Admitting: Cardiology

## 2012-01-31 NOTE — Telephone Encounter (Signed)
Please return call to patient at [phone] regarding medication questions

## 2012-01-31 NOTE — Telephone Encounter (Signed)
Spoke with pt

## 2012-01-31 NOTE — Telephone Encounter (Signed)
Spoke with Kip at [facility] prescription services [phone]. Pt's co pay for Eliquis is  $64 ($192 for 90 days) for 30 days, Xarelto 20mg  $40 ($120 for 90 days) for 30 days, Pradaxa 150mg  bid $40 ($120 for 90 days) for 30 days. Warfarin is $9 for a 90 day supply. Pt has enough Eliquis to last until appt with Dr Shirlee Latch 02/06/12. Pt will discuss options with Dr Shirlee Latch at time of appt 02/06/12.

## 2012-01-31 NOTE — Telephone Encounter (Signed)
Spoke with pt again and she will discuss medication options with Dr Shirlee Latch 02/06/12.

## 2012-01-31 NOTE — Telephone Encounter (Signed)
Pt states when she got her first prescription of Eliquis filled about a month ago her co pay was $25. When she went to get refill  for Eliquis she was told her co pay would be $54.

## 2012-02-06 ENCOUNTER — Ambulatory Visit (INDEPENDENT_AMBULATORY_CARE_PROVIDER_SITE_OTHER): Payer: BC Managed Care – PPO | Admitting: Cardiology

## 2012-02-06 ENCOUNTER — Encounter: Payer: Self-pay | Admitting: Cardiology

## 2012-02-06 VITALS — BP 180/64 | HR 98 | Ht 68.0 in | Wt 174.0 lb

## 2012-02-06 DIAGNOSIS — I4891 Unspecified atrial fibrillation: Secondary | ICD-10-CM

## 2012-02-06 DIAGNOSIS — I4729 Other ventricular tachycardia: Secondary | ICD-10-CM

## 2012-02-06 DIAGNOSIS — I472 Ventricular tachycardia, unspecified: Secondary | ICD-10-CM | POA: Insufficient documentation

## 2012-02-06 DIAGNOSIS — I10 Essential (primary) hypertension: Secondary | ICD-10-CM

## 2012-02-06 MED ORDER — DILTIAZEM HCL ER COATED BEADS 360 MG PO CP24: 360.0000 mg | ORAL_CAPSULE | Freq: Every day | ORAL | Status: DC

## 2012-02-06 MED ORDER — LISINOPRIL 20 MG PO TABS: 20.0000 mg | ORAL_TABLET | Freq: Every day | ORAL | Status: DC

## 2012-02-06 MED ORDER — RIVAROXABAN 20 MG PO TABS: 20.0000 mg | ORAL_TABLET | Freq: Every day | ORAL | Status: DC

## 2012-02-06 MED ORDER — DIAZEPAM 5 MG PO TABS: ORAL_TABLET | ORAL | Status: DC

## 2012-02-06 NOTE — Patient Instructions (Addendum)
Stop aspirin.  Stop Eliquis(apixiban).  Start Xarelto 20 mg daily. This is in the place of Eliquis.  Start lisinopril 20mg  daily. Take this with lisinopril/HCT.  Increase Diltiazem CD (cardizem CD) to 360mg  daily.  Your physician recommends that you return for lab work in: 2 weeks--TSH/BMET.  Your physician has requested that you have a cardiac MRI. Cardiac MRI uses a computer to create images of your heart as its beating, producing both still and moving pictures of your heart and major blood vessels. For further information please visit [email or web address]. Please follow the instruction sheet given to you today for more information. You can take Valium 5mg  1 hour before the cardiac MRI.  Your physician recommends that you schedule a follow-up appointment in: 1 month with Dr Shirlee Latch.

## 2012-02-06 NOTE — Assessment & Plan Note (Addendum)
Paroxysmal atrial fibrillation.  She is in NSR today.  1 episode of palpitations since she was in the hospital.  I am going to have her continue Toprol XL 200 mg daily and increase diltiazem CD to 360 mg daily given HTN.  CHADSVASC = 3 (female, HTN, DM).  She is on apixaban currently but cannot afford the copay.  The copay for [place] is less, so I will have her switch to Xeralto 20 mg daily.  She needs to stop ASA.  We will recheck TSH.

## 2012-02-06 NOTE — Progress Notes (Signed)
Patient ID: Alison Porter, female   DOB: Nov 20, 1955, 56 y.o.   MRN: [id number] PCP: Dr. Clovis Riley  56 yo with newly-diagnosed paroxysmal atrial fibrillation who also had VT during an exercise treadmill test presents for cardiology followup.  She was admitted in 6/13 with atrial fibrillation/RVR that was symptomatic (tachypalpitations).  She had no prior documented atrial fibrillation.  She converted back to NSR after getting diltiazem.  She was started on apixaban and discharged home.  Echo showed normal EF.  Given ST depression and some chest tightness with afib/RVR, I had her do an ETT-myoview.  The myoview showed no evidence for ischemia or infarction, but during the treadmill test she developed self-limited VT with rate in the 160s.  I reviewed the strips with Dr. 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    Since that time, she has only had one episode of palpitations.  This happened yesterday: she felt a fast HR and felt lightheaded.  No syncope.  Episode lasted only a few minutes.  No exertional dyspnea.  No chest pain.  She says that she cannot afford the copay for apixaban and needs to go on something else.  BP has been running high.  She feels very anxious and is sleeping poorly.   ECG: NSR, LAFB, nonspecific T wave changes  Labs (6/13): K 4.2, creatinine 0.66, LDL 58, HDL 33, TSH 5.8, free T4 normal  PMH: 1. DM 2. HTN 3. Hypothyroidism 4. Hyperlipidemia 5. Atrial fibrillation: Paroxysmal.  Admitted 6/13 with afib/RVR, back to NSR on diltiazem.  Echo (7/13): EF 55-60%, mild MR, normal RV.  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  Noted during ETT in 7/13.  7. 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    SH: Single, lives in [place] with daughter.  Unemployed.  Prior smoker, none now.   FH: Sister with CAD diagnosed in her 41s.   ROS: All systems reviewed  and negative except as per HPI.   Current Outpatient Prescriptions  Medication Sig Dispense Refill  . fish oil-omega-3 fatty acids 1000 MG capsule Take 1 g by mouth daily.      . hydrALAZINE (APRESOLINE) 100 MG tablet Take 100 mg by mouth 2 (two) times daily.      . insulin lispro protamine-insulin lispro (HUMALOG 75/25) (75-25) 100 UNIT/ML SUSP Inject 20 Units into the skin 2 (two) times daily with a meal.      . IRON PO Take 1 tablet by mouth 2 (two) times daily.      Marland Kitchen levothyroxine (SYNTHROID, LEVOTHROID) 50 MCG tablet Take 50 mcg by mouth daily.      Marland Kitchen lisinopril-hydrochlorothiazide (PRINZIDE,ZESTORETIC) 20-25 MG per tablet Take 1 tablet by mouth daily.  30 tablet  0  . metFORMIN (GLUCOPHAGE) 500 MG tablet Take 1,000 mg by mouth 2 (two) times daily with a meal.      . metoprolol (TOPROL-XL) 200 MG 24 hr tablet Take 200 mg by mouth daily.      . pravastatin (PRAVACHOL) 20 MG tablet Take 1 tablet by mouth Daily.      Marland Kitchen DISCONTD: diltiazem (CARDIZEM CD) 240 MG 24 hr capsule Take 1 capsule (240 mg total) by mouth daily.  30 capsule  0  . diazepam (VALIUM) 5 MG tablet Take 1 tablet one hour before the Cardiac MRI  1 tablet  0  . diltiazem (CARDIZEM CD) 360 MG 24 hr capsule Take 1 capsule (360  mg total) by mouth daily.  30 capsule  6  . lisinopril (PRINIVIL,ZESTRIL) 20 MG tablet Take 1 tablet (20 mg total) by mouth daily.  30 tablet  6  . Rivaroxaban (XARELTO) 20 MG TABS Take 1 tablet (20 mg total) by mouth daily.  30 tablet  6    BP 180/64  Pulse 98  Ht 5\' 8"  (1.727 m)  Wt 174 lb (78.926 kg)  BMI 26.46 kg/m2  LMP 01/07/2011 General: NAD Neck: No JVD, no thyromegaly or thyroid nodule.  Lungs: Clear to auscultation bilaterally with normal respiratory effort. CV: Nondisplaced PMI.  Heart regular S1/S2, no S3/S4, no murmur.  No peripheral edema.  No carotid bruit.  Normal pedal pulses.  Abdomen: Soft, nontender, no hepatosplenomegaly, no distention.  Skin: Intact without lesions or rashes.   Neurologic: Alert and oriented x 3.  Psych: Anxious. Extremities: No clubbing or cyanosis.  HEENT: Normal.

## 2012-02-06 NOTE — Assessment & Plan Note (Signed)
Suspicion for RVOT VT.  VT was seen during ETT recently, rate was 160 bpm.  There was LBBB/inferior axis morphology.  Myoview did not show evidence for ischemia or infarction.  RVOT VT can be suppressed with beta blockers and CCBs.  She is on both.  As above, will increase diltiazem CD today.  Echo showed an apparently normal RV and normal LV EF.  I will, however, get a cardiac MRI to evaluate for LV scar and for any evidence of ARVC.  ECG is not suggestive of ARVC.  I will see her in a month after cardiac MRI and we will see how she is doing symptomatically.  I will likely refer her to Dr. Graciela Husbands for EP evaluation.

## 2012-02-06 NOTE — Assessment & Plan Note (Signed)
BP is running high.  She has been very anxious.  Increasing diltiazem as above.  I will also increase lisinopril to 40 mg daily with BMET in 2 wks.

## 2012-02-12 ENCOUNTER — Inpatient Hospital Stay (HOSPITAL_COMMUNITY): Admission: RE | Admit: 2012-02-12 | Payer: BC Managed Care – PPO | Source: Ambulatory Visit

## 2012-02-12 ENCOUNTER — Other Ambulatory Visit (HOSPITAL_COMMUNITY): Payer: BC Managed Care – PPO

## 2012-02-12 ENCOUNTER — Ambulatory Visit: Payer: No Typology Code available for payment source | Admitting: Cardiology

## 2012-02-13 ENCOUNTER — Encounter: Payer: Self-pay | Admitting: Cardiology

## 2012-02-13 ENCOUNTER — Ambulatory Visit
Admission: RE | Admit: 2012-02-13 | Discharge: 2012-02-13 | Disposition: A | Discharge status: Home / Self Care | Payer: BC Managed Care – PPO | Source: Ambulatory Visit | Attending: Family Medicine | Admitting: Family Medicine

## 2012-02-13 DIAGNOSIS — Z1231 Encounter for screening mammogram for malignant neoplasm of breast: Secondary | ICD-10-CM

## 2012-02-18 ENCOUNTER — Telehealth: Payer: Self-pay | Admitting: Cardiology

## 2012-02-18 NOTE — Telephone Encounter (Signed)
New Problem:    Patient called in wanting to know if she still needed to have her lab appointment on Wed since she had labs drawn at he PCP about 4 weeks ago.  Please call back.

## 2012-02-19 NOTE — Telephone Encounter (Signed)
Patient called was told spoke to DOD Dr.Cooper he advised to hold xarelto 48 hrs before dental surgery.Dr.Sharon Stone's office called and left message ok for patient to hold xarelto 48 hrs before dental surgery.

## 2012-02-19 NOTE — Telephone Encounter (Signed)
Patient called stated she is scheduled for dental surgery 03/04/12 and needs to hold xarelto.Patient was told Dr.McLean out of office.Will check DOD DR.Cooper and call her back.

## 2012-02-19 NOTE — Telephone Encounter (Signed)
New problem;  Per [name],. Dental surgery on 8/27. Can she come off blood thinner prior to procedure

## 2012-02-20 ENCOUNTER — Telehealth: Payer: Self-pay | Admitting: *Deleted

## 2012-02-20 ENCOUNTER — Other Ambulatory Visit (INDEPENDENT_AMBULATORY_CARE_PROVIDER_SITE_OTHER): Payer: BC Managed Care – PPO

## 2012-02-20 DIAGNOSIS — I472 Ventricular tachycardia, unspecified: Secondary | ICD-10-CM

## 2012-02-20 DIAGNOSIS — I4729 Other ventricular tachycardia: Secondary | ICD-10-CM

## 2012-02-20 DIAGNOSIS — I4891 Unspecified atrial fibrillation: Secondary | ICD-10-CM

## 2012-02-20 LAB — BASIC METABOLIC PANEL
BUN: 8 mg/dL (ref 6–23)
CO2: 29 mEq/L (ref 19–32)
Calcium: 10.2 mg/dL (ref 8.4–10.5)
Chloride: 101 mEq/L (ref 96–112)
Creatinine, Ser: 0.9 mg/dL (ref 0.4–1.2)
GFR: 89.33 mL/min (ref >60.00)
Glucose, Bld: 108 mg/dL — ABNORMAL HIGH (ref 70–99)
Potassium: 3.3 mEq/L — ABNORMAL LOW (ref 3.5–5.1)
Sodium: 139 mEq/L (ref 135–145)

## 2012-02-20 MED ORDER — LISINOPRIL 40 MG PO TABS: 40.0000 mg | ORAL_TABLET | Freq: Every day | ORAL | Status: DC

## 2012-02-20 NOTE — Telephone Encounter (Signed)
Labs reviewed with dr taylor(DOD)--pt notified K+ was low--please d/c lisinopril with HCTZ--pt agrees, and states in past she remembers having to d/c diuretic due to low K+--she will continue plain lisinopril--nt

## 2012-02-26 ENCOUNTER — Inpatient Hospital Stay (HOSPITAL_COMMUNITY): Admission: RE | Admit: 2012-02-26 | Payer: BC Managed Care – PPO | Source: Ambulatory Visit

## 2012-02-27 ENCOUNTER — Other Ambulatory Visit: Payer: Self-pay | Admitting: Cardiology

## 2012-02-27 DIAGNOSIS — I472 Ventricular tachycardia, unspecified: Secondary | ICD-10-CM

## 2012-02-27 DIAGNOSIS — I4891 Unspecified atrial fibrillation: Secondary | ICD-10-CM

## 2012-02-27 MED ORDER — LISINOPRIL 40 MG PO TABS: 40.0000 mg | ORAL_TABLET | Freq: Every day | ORAL | Status: DC

## 2012-02-28 ENCOUNTER — Telehealth: Payer: Self-pay | Admitting: Cardiology

## 2012-02-28 DIAGNOSIS — E876 Hypokalemia: Secondary | ICD-10-CM

## 2012-02-28 MED ORDER — LISINOPRIL 20 MG PO TABS: 20.0000 mg | ORAL_TABLET | Freq: Two times a day (BID) | ORAL | Status: DC

## 2012-02-28 NOTE — Telephone Encounter (Signed)
New msg Pt had some questions about lisinorpil. Please call

## 2012-02-28 NOTE — Telephone Encounter (Signed)
Spoke with pt. She will take lisinopril 20mg  bid. She will return for [facility] 03/05/12.

## 2012-02-28 NOTE — Telephone Encounter (Signed)
Continue lisinopril 20 mg bid and come in for repeat BMET.

## 2012-02-28 NOTE — Telephone Encounter (Signed)
Spoke with pt. Pt has BMET 02/20/12. K 3.3 . She was advised to stop lisinopril/HCT 20/25 and continue lisinopril 20mg  daily. Pt has been taking two 20mg  lisinopril daily since she was advised to stop lisinopril/HCT. Pt is asking for further instructions about lisinopril dosing. I will forward to Dr Shirlee Latch for review and recommendations.

## 2012-02-29 ENCOUNTER — Telehealth: Payer: Self-pay | Admitting: Cardiology

## 2012-02-29 NOTE — Telephone Encounter (Signed)
Spoke with pt. Pt states her period has been heavier than usual today. She started her period 2 days ago. She had a regular period last month but her periods have been more irregular in the last few months. Reviewed with Weston Brass , pharmacist. She did not recommend any changes at present time. Pt advised to if period remains heavy or lasts longer than usual she should contact her GYN or PCP. I will forward to Dr Shirlee Latch for review.

## 2012-02-29 NOTE — Telephone Encounter (Signed)
Pt on blood thinners and her period is extremely heavy, passing clots, what to do?

## 2012-03-04 ENCOUNTER — Telehealth: Payer: Self-pay | Admitting: Cardiology

## 2012-03-04 NOTE — Telephone Encounter (Signed)
Pt was to have dental extraction today but because of elevated BP the dentist declined. I told pt to resume xarelto today, she has been off for 48 hours for extraction. During dentist app bp was 167/83, 177/89, 170/92. Pt had bp check yesterday and it was 143/71. Pt states she was very nervous today but had taken all her bp med's two hours prior to app and took alprazolam 0.25 mg prior to appointment. Pt has app with Dr Shirlee Latch 03/12/12 and will discuss bp further at that appointment.  Told pt to avoid caffeine and to get bp readings several times before next app and bring with her, pt agreed to plan.

## 2012-03-04 NOTE — Telephone Encounter (Signed)
Please return call to patient [phone], dentist was not able to extract tooth at todays visit.

## 2012-03-05 ENCOUNTER — Other Ambulatory Visit (INDEPENDENT_AMBULATORY_CARE_PROVIDER_SITE_OTHER): Payer: BC Managed Care – PPO

## 2012-03-05 DIAGNOSIS — E876 Hypokalemia: Secondary | ICD-10-CM

## 2012-03-05 DIAGNOSIS — I472 Ventricular tachycardia, unspecified: Secondary | ICD-10-CM

## 2012-03-05 DIAGNOSIS — I4891 Unspecified atrial fibrillation: Secondary | ICD-10-CM

## 2012-03-06 LAB — BASIC METABOLIC PANEL
BUN: 10 mg/dL (ref 6–23)
CO2: 26 mEq/L (ref 19–32)
Calcium: 9.5 mg/dL (ref 8.4–10.5)
Chloride: 107 mEq/L (ref 96–112)
Creatinine, Ser: 0.8 mg/dL (ref 0.4–1.2)
GFR: 94.43 mL/min (ref >60.00)
Glucose, Bld: 91 mg/dL (ref 70–99)
Potassium: 3.9 mEq/L (ref 3.5–5.1)
Sodium: 139 mEq/L (ref 135–145)

## 2012-03-12 ENCOUNTER — Ambulatory Visit (INDEPENDENT_AMBULATORY_CARE_PROVIDER_SITE_OTHER): Payer: BC Managed Care – PPO | Admitting: Cardiology

## 2012-03-12 ENCOUNTER — Encounter: Payer: Self-pay | Admitting: Cardiology

## 2012-03-12 VITALS — BP 152/70 | HR 84 | Ht 68.0 in | Wt 178.0 lb

## 2012-03-12 DIAGNOSIS — I10 Essential (primary) hypertension: Secondary | ICD-10-CM

## 2012-03-12 DIAGNOSIS — I4891 Unspecified atrial fibrillation: Secondary | ICD-10-CM

## 2012-03-12 DIAGNOSIS — I472 Ventricular tachycardia, unspecified: Secondary | ICD-10-CM

## 2012-03-12 DIAGNOSIS — I4729 Other ventricular tachycardia: Secondary | ICD-10-CM

## 2012-03-12 MED ORDER — CHLORTHALIDONE 25 MG PO TABS: ORAL_TABLET | ORAL | Status: DC

## 2012-03-12 MED ORDER — POTASSIUM CHLORIDE CRYS ER 20 MEQ PO TBCR: 20.0000 meq | EXTENDED_RELEASE_TABLET | Freq: Two times a day (BID) | ORAL | Status: DC

## 2012-03-12 NOTE — Assessment & Plan Note (Signed)
Suspicion for RVOT VT.  VT was seen during ETT recently, rate was 160 bpm.  There was LBBB/inferior axis morphology.  Myoview did not show evidence for ischemia or infarction.  RVOT VT can be suppressed with beta blockers and CCBs.  She is on both.   Echo showed an apparently normal RV and normal LV EF.  I am still waiting for her cardiac MRI to evaluate for LV scar and for any evidence of ARVC (I will reschedule).  ECG is not suggestive of ARVC.

## 2012-03-12 NOTE — Assessment & Plan Note (Signed)
BP has been running high.  I will add chlorthalidone 12.5 mg daily + KCl 20 mEq daily.  This will also likely help with the mild ankle edema that she has (probably related to diltiazem).   BMET and BP check in 2 wks.

## 2012-03-12 NOTE — Progress Notes (Signed)
Patient ID: Alison Porter, female   DOB: 03-23-1956, 56 y.o.   MRN: [id number] PCP: Dr. Clovis Riley  56 yo with paroxysmal atrial fibrillation who also had VT during an exercise treadmill test presents for cardiology followup.  She was admitted in 6/13 with atrial fibrillation/RVR that was symptomatic (tachypalpitations).  She had no prior documented atrial fibrillation.  She converted back to NSR after getting diltiazem.  She was started on apixaban and discharged home.  Echo showed normal EF.  Given ST depression and some chest tightness with afib/RVR, I had her do an ETT-myoview.  The myoview showed no evidence for ischemia or infarction, but during the treadmill test she developed self-limited VT with rate in the 160s.  I reviewed the strips with Dr. 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    She has been symptomatically stable since last appointment.  She has not yet had the cardiac MRI.  She has had no runs of tachypalpitations.  No exertional dyspnea or chest pain.  She has had some mild ankle swelling since starting diltiazem.  BP has been running high when she has had it checked recently, and it is 152/70 today.    ECG: NSR, 1st degree AV block  Labs (6/13): K 4.2, creatinine 0.66, LDL 58, HDL 33, TSH 5.8, free T4 normal Labs (8/13): K 3.9, creatinine 0.8  PMH: 1. DM 2. HTN 3. Hypothyroidism 4. Hyperlipidemia 5. Atrial fibrillation: Paroxysmal.  Admitted 6/13 with afib/RVR, back to NSR on diltiazem.  Echo (7/13): EF 55-60%, mild MR, normal RV.  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  Noted during ETT in 7/13.  7. 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    SH: Single, lives in [place] with daughter.  Unemployed.  Prior smoker, none now.   FH: Sister with CAD diagnosed in her 10s.   ROS: All systems reviewed and negative except as per HPI.   Current  Outpatient Prescriptions  Medication Sig Dispense Refill  . ALPRAZolam (XANAX) 0.25 MG tablet 0.25 mg as needed.       . diltiazem (CARDIZEM CD) 360 MG 24 hr capsule Take 1 capsule (360 mg total) by mouth daily.  30 capsule  6  . fish oil-omega-3 fatty acids 1000 MG capsule Take 1 g by mouth daily.      . hydrALAZINE (APRESOLINE) 100 MG tablet Take 100 mg by mouth 2 (two) times daily.      . insulin lispro protamine-insulin lispro (HUMALOG 75/25) (75-25) 100 UNIT/ML SUSP Inject 20 Units into the skin 2 (two) times daily with a meal.      . IRON PO Take 1 tablet by mouth 2 (two) times daily.      Marland Kitchen levothyroxine (SYNTHROID, LEVOTHROID) 50 MCG tablet Take 50 mcg by mouth daily.      Marland Kitchen lisinopril (PRINIVIL,ZESTRIL) 20 MG tablet Take 1 tablet (20 mg total) by mouth 2 (two) times daily.  60 tablet  6  . metFORMIN (GLUCOPHAGE) 500 MG tablet Take 1,000 mg by mouth 2 (two) times daily with a meal.      . metoprolol (TOPROL-XL) 200 MG 24 hr tablet Take 200 mg by mouth daily.      . pravastatin (PRAVACHOL) 20 MG tablet Take 1 tablet by mouth Daily.      . Rivaroxaban (XARELTO) 20 MG TABS Take 1 tablet (20 mg total) by mouth daily.  30 tablet  6  .  chlorthalidone (HYGROTON) 25 MG tablet Take 1/2 tablet daily  15 tablet  6  . potassium chloride SA (K-DUR,KLOR-CON) 20 MEQ tablet Take 1 tablet (20 mEq total) by mouth 2 (two) times daily.  30 tablet  6    BP 152/70  Pulse 84  Ht 5\' 8"  (1.727 m)  Wt 178 lb (80.74 kg)  BMI 27.06 kg/m2  LMP 01/07/2011 General: NAD Neck: No JVD, no thyromegaly or thyroid nodule.  Lungs: Clear to auscultation bilaterally with normal respiratory effort. CV: Nondisplaced PMI.  Heart regular S1/S2, no S3/S4, 1/6 early SEM.  Trace ankle edema.  No carotid bruit.  Normal pedal pulses.  Abdomen: Soft, nontender, no hepatosplenomegaly, no distention.  Skin: Intact without lesions or rashes.  Neurologic: Alert and oriented x 3.  Psych: Anxious. Extremities: No clubbing or  cyanosis.  HEENT: Normal.

## 2012-03-12 NOTE — Patient Instructions (Addendum)
Start chlorthalidone 12.5mg  daily. This will be one-half 25mg  tablet daily.  Start KCL(potassium) 20 mEq daily.  Your physician recommends that you return for lab work in: 2 weeks--TSH/BMET.  Your physician has requested that you have a cardiac MRI. Cardiac MRI uses a computer to create images of your heart as its beating, producing both still and moving pictures of your heart and major blood vessels. For further information please visit [email or web address]. Please follow the instruction sheet given to you today for more information. Please reschedule the appointment for this.  Take and record your blood pressure daily. I will call you in 2 weeks to get the readings. Luana Shu  Your physician recommends that you schedule a follow-up appointment in: 3 months with Dr Shirlee Latch.

## 2012-03-12 NOTE — Assessment & Plan Note (Signed)
Paroxysmal atrial fibrillation.  She is in NSR today and has had no recent significant palpitations.  Continue Toprol XL and diltiazem.  CHADSVASC = 3 (female, HTN, DM).  She is now on Xeralto but is having trouble affording it.  I will give her samples and work on setting her up for the assistance program.

## 2012-03-17 ENCOUNTER — Encounter: Payer: Self-pay | Admitting: Cardiology

## 2012-03-20 ENCOUNTER — Telehealth: Payer: Self-pay | Admitting: Cardiology

## 2012-03-20 NOTE — Telephone Encounter (Signed)
Spoke with pt, her bp yesterday was 185/98, she rested and then rechecked it and it was 180/94. She is trying to start walking and wanted to know if it was safe to walk with bp that high. Yesterday was the first day she had checked her bp since starting the new med dr Shirlee Latch had given her. Cautioned pt regarding walking with bp that high, pt to track her bp daily for the next several days and call on Monday to report the readings so we can get her bp under control so she can exercise safely. Pt agreed with this plan.

## 2012-03-20 NOTE — Telephone Encounter (Signed)
PT HAS QUESTION RE bp, was high yesterday, ok to wall if hot outside?

## 2012-03-25 ENCOUNTER — Other Ambulatory Visit: Payer: BC Managed Care – PPO

## 2012-03-25 ENCOUNTER — Ambulatory Visit (HOSPITAL_COMMUNITY)
Admission: RE | Admit: 2012-03-25 | Discharge: 2012-03-25 | Disposition: A | Discharge status: Home / Self Care | Payer: BC Managed Care – PPO | Source: Ambulatory Visit | Attending: Cardiology | Admitting: Cardiology

## 2012-03-25 DIAGNOSIS — I472 Ventricular tachycardia, unspecified: Secondary | ICD-10-CM | POA: Insufficient documentation

## 2012-03-25 DIAGNOSIS — I4729 Other ventricular tachycardia: Secondary | ICD-10-CM

## 2012-03-25 DIAGNOSIS — I059 Rheumatic mitral valve disease, unspecified: Secondary | ICD-10-CM | POA: Insufficient documentation

## 2012-03-25 MED ORDER — GADOBENATE DIMEGLUMINE 529 MG/ML IV SOLN
25.0000 mL | Freq: Once | INTRAVENOUS | Status: AC
  Administered 2012-03-25: 25 mL via INTRAVENOUS

## 2012-03-28 ENCOUNTER — Telehealth: Payer: Self-pay | Admitting: *Deleted

## 2012-03-28 DIAGNOSIS — I4891 Unspecified atrial fibrillation: Secondary | ICD-10-CM

## 2012-03-28 DIAGNOSIS — I10 Essential (primary) hypertension: Secondary | ICD-10-CM

## 2012-03-28 NOTE — Telephone Encounter (Signed)
New Problem:    Called in needing to know if they could go with a different medication because they are out of stock for the diltiazem (CARDIZEM CD) 360 MG 24 hr capsule.  Please call back.

## 2012-03-28 NOTE — Telephone Encounter (Signed)
It will be two Cardizem CD 180mg  tablets daily. The pharmacy will make pt aware of this change.

## 2012-03-28 NOTE — Telephone Encounter (Signed)
Spoke with Drey at [facility]. They are out of stock of Cardizem CD 360mg --they will replace it with Cardizem CB 180mg  two per day.

## 2012-03-28 NOTE — Telephone Encounter (Signed)
Spoke with pt. Recent BP readings --03/19/12 185/98 87    03/20/12 157/86 62    03/21/12 117/76 63    03/22/12 112/71 70 03/23/12 120/82 70    03/24/12 116/72 64   03/25/12 143/82 (day of MRI) . Pt states she is feeling better in general. She is coming for BMET next week. I will forward to Dr Shirlee Latch for review.

## 2012-03-28 NOTE — Telephone Encounter (Signed)
HTN (hypertension) - Alison Ancona, MD 03/12/2012 10:35 PM Signed  BP has been running high. I will add chlorthalidone 12.5 mg daily + KCl 20 mEq daily. This will also likely help with the mild ankle edema that she has (probably related to diltiazem). BMET and BP check in 2 wks.

## 2012-03-28 NOTE — Telephone Encounter (Signed)
03/28/12--spoke with pt. Pt states she is unable to put her hands on her list of BP readings. She will call me back when she finds her list. She states in general her BP had been better.

## 2012-04-01 ENCOUNTER — Other Ambulatory Visit: Payer: BC Managed Care – PPO

## 2012-04-03 ENCOUNTER — Other Ambulatory Visit (INDEPENDENT_AMBULATORY_CARE_PROVIDER_SITE_OTHER): Payer: BC Managed Care – PPO

## 2012-04-03 DIAGNOSIS — I10 Essential (primary) hypertension: Secondary | ICD-10-CM

## 2012-04-03 DIAGNOSIS — I4891 Unspecified atrial fibrillation: Secondary | ICD-10-CM

## 2012-04-04 LAB — BASIC METABOLIC PANEL
BUN: 14 mg/dL (ref 6–23)
CO2: 26 mEq/L (ref 19–32)
Calcium: 9.6 mg/dL (ref 8.4–10.5)
Chloride: 105 mEq/L (ref 96–112)
Creatinine, Ser: 1 mg/dL (ref 0.4–1.2)
GFR: 74.89 mL/min (ref >60.00)
Glucose, Bld: 109 mg/dL — ABNORMAL HIGH (ref 70–99)
Potassium: 3.9 mEq/L (ref 3.5–5.1)
Sodium: 140 mEq/L (ref 135–145)

## 2012-04-04 LAB — TSH: TSH: 2.99 u[IU]/mL (ref 0.35–5.50)

## 2012-05-25 ENCOUNTER — Inpatient Hospital Stay (HOSPITAL_COMMUNITY)
Admission: AD | Admit: 2012-05-25 | Discharge: 2012-05-25 | Discharge status: Against Medical Advice | Payer: BC Managed Care – PPO | Source: Ambulatory Visit | Attending: Obstetrics and Gynecology | Admitting: Obstetrics and Gynecology

## 2012-05-25 DIAGNOSIS — N920 Excessive and frequent menstruation with regular cycle: Secondary | ICD-10-CM | POA: Insufficient documentation

## 2012-05-25 DIAGNOSIS — R51 Headache: Secondary | ICD-10-CM | POA: Insufficient documentation

## 2012-05-25 DIAGNOSIS — R109 Unspecified abdominal pain: Secondary | ICD-10-CM | POA: Insufficient documentation

## 2012-05-25 LAB — URINE MICROSCOPIC-ADD ON

## 2012-05-25 LAB — URINALYSIS, ROUTINE W REFLEX MICROSCOPIC
Bilirubin Urine: NEGATIVE
Glucose, UA: NEGATIVE mg/dL
Ketones, ur: NEGATIVE mg/dL
Leukocytes, UA: NEGATIVE
Nitrite: NEGATIVE
Protein, ur: NEGATIVE mg/dL
Specific Gravity, Urine: 1.01 (ref 1.005–1.030)
Urobilinogen, UA: 0.2 mg/dL (ref 0.0–1.0)
pH: 6 (ref 5.0–8.0)

## 2012-05-25 LAB — POCT PREGNANCY, URINE: Preg Test, Ur: NEGATIVE

## 2012-05-25 NOTE — ED Notes (Signed)
Not in lobby second call

## 2012-05-25 NOTE — MAU Note (Signed)
Pt not in lobby 3rd call will d/c pt as AMA

## 2012-05-25 NOTE — MAU Note (Signed)
Pt reports having a heavy period since Monday. C/o heache and some cramping  Passing moderate amount of clots changing pad ever 30-40 min.

## 2012-05-25 NOTE — ED Notes (Signed)
Not in lobby first call

## 2012-06-18 ENCOUNTER — Ambulatory Visit: Payer: BC Managed Care – PPO | Admitting: Cardiology

## 2012-07-16 ENCOUNTER — Other Ambulatory Visit (HOSPITAL_COMMUNITY)
Admission: RE | Admit: 2012-07-16 | Discharge: 2012-07-16 | Disposition: A | Discharge status: Home / Self Care | Payer: BC Managed Care – PPO | Source: Ambulatory Visit | Attending: Family Medicine | Admitting: Family Medicine

## 2012-07-16 ENCOUNTER — Other Ambulatory Visit: Payer: Self-pay | Admitting: Family Medicine

## 2012-07-16 DIAGNOSIS — Z124 Encounter for screening for malignant neoplasm of cervix: Secondary | ICD-10-CM | POA: Insufficient documentation

## 2012-08-29 ENCOUNTER — Telehealth: Payer: Self-pay | Admitting: Cardiology

## 2012-08-29 NOTE — Telephone Encounter (Signed)
Pt's dr office  Sending  Fax re holding blood thinner

## 2012-08-29 NOTE — Telephone Encounter (Signed)
Pt aware Dr Shirlee Latch is not in the office today to answer this question.  She is aware he will be back on Monday and will be addressed at that time.

## 2012-08-29 NOTE — Telephone Encounter (Signed)
Pt aware only needs to hold 2 days before colonoscopy.

## 2012-08-29 NOTE — Telephone Encounter (Signed)
No history of CVA, does not need bridging.  If she is still on Xarelto, which I think she is, she only needs to hold med for 2 days prior to colonoscopy.

## 2012-08-29 NOTE — Telephone Encounter (Signed)
Pt needs to be called to let her know today because she will have to hold med starting this weekend [phone]

## 2012-08-29 NOTE — Telephone Encounter (Signed)
New problem   Colon screening on 2/28. Need to come off blood thinner x 4 days

## 2012-08-29 NOTE — Telephone Encounter (Signed)
Fax received and put on [employer] for review by Dr Shirlee Latch.

## 2012-08-29 NOTE — Telephone Encounter (Signed)
Will forward to MD for review and orders 

## 2012-09-02 ENCOUNTER — Encounter: Payer: Self-pay | Admitting: Cardiology

## 2012-09-02 ENCOUNTER — Ambulatory Visit (INDEPENDENT_AMBULATORY_CARE_PROVIDER_SITE_OTHER): Payer: BC Managed Care – PPO | Admitting: Cardiology

## 2012-09-02 VITALS — BP 144/82 | HR 68 | Ht 68.0 in | Wt 177.0 lb

## 2012-09-02 DIAGNOSIS — I10 Essential (primary) hypertension: Secondary | ICD-10-CM

## 2012-09-02 DIAGNOSIS — I4891 Unspecified atrial fibrillation: Secondary | ICD-10-CM

## 2012-09-02 DIAGNOSIS — E039 Hypothyroidism, unspecified: Secondary | ICD-10-CM

## 2012-09-02 DIAGNOSIS — I472 Ventricular tachycardia, unspecified: Secondary | ICD-10-CM

## 2012-09-02 DIAGNOSIS — I4729 Other ventricular tachycardia: Secondary | ICD-10-CM

## 2012-09-02 MED ORDER — POTASSIUM CHLORIDE CRYS ER 20 MEQ PO TBCR: 20.0000 meq | EXTENDED_RELEASE_TABLET | Freq: Two times a day (BID) | ORAL | Status: DC

## 2012-09-02 MED ORDER — CHLORTHALIDONE 25 MG PO TABS: 25.0000 mg | ORAL_TABLET | Freq: Every day | ORAL | Status: DC

## 2012-09-02 NOTE — Patient Instructions (Addendum)
Increase chlorthalidone to 25mg  daily.   Increase KCL 20 mEq two times a day.  Your physician recommends that you return for lab work in: 2 weeks--BMET.  Take and record your blood pressure. I will call you in 2 weeks to get the readings. Luana Shu  You have been referred to Dr Rhea Belton at [facility] to evaluate for colonoscopy.  Your physician recommends that you schedule a follow-up appointment in: 1 month with PA/NP.   Your physician recommends that you schedule a follow-up appointment in: 4 months with Dr Shirlee Latch.

## 2012-09-03 NOTE — Progress Notes (Signed)
Patient ID: Alison Porter, female   DOB: 1955/08/24, 57 y.o.   MRN: [id number] PCP: Dr. Clovis Riley  57 yo with paroxysmal atrial fibrillation who also had VT during an exercise treadmill test presents for cardiology followup.  She was admitted in 6/13 with atrial fibrillation/RVR that was symptomatic (tachypalpitations).  She had no prior documented atrial fibrillation.  She converted back to NSR after getting diltiazem.  She was started on apixaban and discharged home.  Echo showed normal EF.  Given ST depression and some chest tightness with afib/RVR, I had her do an ETT-myoview.  The myoview showed no evidence for ischemia or infarction, but during the treadmill test she developed self-limited VT with rate in the 160s.  I reviewed the strips with Dr. 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  Cardiac MRI was done showing no evidence for ARVC.   She has been symptomatically stable since last appointment.   She has had no runs of tachypalpitations.  No exertional dyspnea or chest pain.  BP continues to run high.  Her dentist will not do an extraction on her until BP is under better control.  BP is mildly elevated today.  Last BP she checked at home was 170/78.   ECG: NSR, normal  Labs (6/13): K 4.2, creatinine 0.66, LDL 58, HDL 33, TSH 5.8, free T4 normal Labs (8/13): K 3.9, creatinine 0.8 Labs (9/13): TSH normal, K 3.9, creatinine 1.0  PMH: 1. DM 2. HTN 3. Hypothyroidism 4. Hyperlipidemia 5. Atrial fibrillation: Paroxysmal.  Admitted 6/13 with afib/RVR, back to NSR on diltiazem.  Echo (7/13): EF 55-60%, mild MR, normal RV.  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  Noted during ETT in 7/13.  Cardiac MRI (7/13): EF 74%, normal RV size and systolic function with no evidence for ARVC, mild MR, no delayed enhancement.  7. 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    SH:  Single, lives in [place] with daughter.  Unemployed.  Prior smoker, none now.   FH: Sister with CAD diagnosed in her 7s.   ROS: All systems reviewed and negative except as per HPI.   Current Outpatient Prescriptions  Medication Sig Dispense Refill  . ALPRAZolam (XANAX) 0.25 MG tablet 0.25 mg as needed.       . diltiazem (CARDIZEM CD) 360 MG 24 hr capsule Take 1 capsule (360 mg total) by mouth daily.  30 capsule  6  . fish oil-omega-3 fatty acids 1000 MG capsule Take 1 g by mouth daily.      . hydrALAZINE (APRESOLINE) 100 MG tablet Take 100 mg by mouth 2 (two) times daily.      . insulin lispro protamine-insulin lispro (HUMALOG 75/25) (75-25) 100 UNIT/ML SUSP Inject 20 Units into the skin 2 (two) times daily with a meal.      . IRON PO Take 1 tablet by mouth 2 (two) times daily.      Marland Kitchen levothyroxine (SYNTHROID, LEVOTHROID) 50 MCG tablet Take 50 mcg by mouth daily.      Marland Kitchen lisinopril (PRINIVIL,ZESTRIL) 20 MG tablet Take 1 tablet (20 mg total) by mouth 2 (two) times daily.  60 tablet  6  . metFORMIN (GLUCOPHAGE) 500 MG tablet Take 1,000 mg by mouth 2 (two) times daily with a meal.      . metoprolol (TOPROL-XL) 200 MG 24 hr tablet Take 200 mg by mouth daily.      . pravastatin (PRAVACHOL) 20  MG tablet Take 1 tablet by mouth Daily.      . Rivaroxaban (XARELTO) 20 MG TABS Take 1 tablet (20 mg total) by mouth daily.  30 tablet  6  . chlorthalidone (HYGROTON) 25 MG tablet Take 1 tablet (25 mg total) by mouth daily.  30 tablet  6  . potassium chloride SA (K-DUR,KLOR-CON) 20 MEQ tablet Take 1 tablet (20 mEq total) by mouth 2 (two) times daily.  60 tablet  6   No current facility-administered medications for this visit.    BP 144/82  Pulse 68  Ht 5\' 8"  (1.727 m)  Wt 177 lb (80.287 kg)  BMI 26.92 kg/m2  LMP 01/07/2011 General: NAD Neck: No JVD, no thyromegaly or thyroid nodule.  Lungs: Clear to auscultation bilaterally with normal respiratory effort. CV: Nondisplaced PMI.  Heart regular  S1/S2, no S3/S4, 1/6 early SEM.  Trace ankle edema.  No carotid bruit.  Normal pedal pulses.  Abdomen: Soft, nontender, no hepatosplenomegaly, no distention.  Neurologic: Alert and oriented x 3.  Psych: Anxious. Extremities: No clubbing or cyanosis.   Assessment/Plan: 1. Atrial fibrillation: Paroxysmal atrial fibrillation. She is in NSR today and has had no recent significant palpitations. Continue Toprol XL and diltiazem. CHADSVASC = 3 (female, HTN, DM). She is now on Xeralto. 2. HTN: BP still running high.  I will increase chlorthalidone to 25 mg daily today and will increase KCl to 40 mEq daily.  She will check her BP at home and we will call for readings in 2 wks.  BMET in 2 wks.   3. Ventricular tachycardia: Suspicion for RVOT VT. VT was seen during ETT, rate was 160 bpm. There was LBBB/inferior axis morphology. Myoview did not show evidence for ischemia or infarction. RVOT VT can be suppressed with beta blockers and CCBs. She is on both. Cardiac MRI and ECG were not suggestive of ARVC.  4. She would like a GI referral, which I will give her.  She can come off coumadin without bridging for colonoscopy.   Marca Ancona 09/03/2012

## 2012-09-04 ENCOUNTER — Other Ambulatory Visit: Payer: Self-pay | Admitting: *Deleted

## 2012-09-04 DIAGNOSIS — E876 Hypokalemia: Secondary | ICD-10-CM

## 2012-09-04 MED ORDER — LISINOPRIL 20 MG PO TABS: 20.0000 mg | ORAL_TABLET | Freq: Two times a day (BID) | ORAL | Status: DC

## 2012-09-11 ENCOUNTER — Encounter: Payer: Self-pay | Admitting: Internal Medicine

## 2012-09-11 ENCOUNTER — Encounter: Payer: Self-pay | Admitting: Cardiology

## 2012-09-17 ENCOUNTER — Other Ambulatory Visit (INDEPENDENT_AMBULATORY_CARE_PROVIDER_SITE_OTHER): Payer: BC Managed Care – PPO

## 2012-09-17 ENCOUNTER — Ambulatory Visit (INDEPENDENT_AMBULATORY_CARE_PROVIDER_SITE_OTHER): Payer: BC Managed Care – PPO | Admitting: Internal Medicine

## 2012-09-17 ENCOUNTER — Other Ambulatory Visit: Payer: BC Managed Care – PPO

## 2012-09-17 ENCOUNTER — Encounter: Payer: Self-pay | Admitting: Internal Medicine

## 2012-09-17 VITALS — BP 152/60 | HR 76 | Ht 67.75 in | Wt 174.0 lb

## 2012-09-17 DIAGNOSIS — Z7901 Long term (current) use of anticoagulants: Secondary | ICD-10-CM

## 2012-09-17 DIAGNOSIS — I10 Essential (primary) hypertension: Secondary | ICD-10-CM

## 2012-09-17 DIAGNOSIS — Z1211 Encounter for screening for malignant neoplasm of colon: Secondary | ICD-10-CM

## 2012-09-17 DIAGNOSIS — I4891 Unspecified atrial fibrillation: Secondary | ICD-10-CM

## 2012-09-17 LAB — BASIC METABOLIC PANEL
BUN: 15 mg/dL (ref 6–23)
CO2: 25 mEq/L (ref 19–32)
Calcium: 9.7 mg/dL (ref 8.4–10.5)
Chloride: 103 mEq/L (ref 96–112)
Creatinine, Ser: 1 mg/dL (ref 0.4–1.2)
GFR: 72.23 mL/min (ref >60.00)
Glucose, Bld: 115 mg/dL — ABNORMAL HIGH (ref 70–99)
Potassium: 3.9 mEq/L (ref 3.5–5.1)
Sodium: 137 mEq/L (ref 135–145)

## 2012-09-17 MED ORDER — PEG-KCL-NACL-NASULF-NA ASC-C 100 G PO SOLR: 1.0000 | Freq: Once | ORAL | Status: DC

## 2012-09-17 NOTE — Patient Instructions (Addendum)
You have been scheduled for a colonoscopy with propofol. Please follow written instructions given to you at your visit today.  Please pick up your prep kit at the pharmacy within the next 1-3 days. If you use inhalers (even only as needed), please bring them with you on the day of your procedure.  We have sent the following medications to your pharmacy for you to pick up at your convenience: Moviprep; you were given instructions today at your office visit

## 2012-09-17 NOTE — Progress Notes (Signed)
Patient ID: Alison Porter, female   DOB: [birth year], 56 y.o.   MRN: [id number]  SUBJECTIVE: HPI Alison Porter is a 57 year old female with a past medical history atrial fibrillation on Xarelto, diabetes, hypothyroidism, hypertension, chronic [place]-standing anemia who seen in consultation at the request of Dr. Shirlee Latch for evaluation screening colonoscopy. Patient is alone today. She denies specific complaint. She has seen some very scant bright red blood on the toilet tissue on occasion. This is not occurring with each bowel movement and is not associated with any perianal or rectal pain. She reports previously having had loose stools which he associated with metformin. Her metformin dose has been decreased and her stools her last loose now. She does note occasionally thinner stools. No right diarrhea or constipation. No abdominal pain. No nausea or vomiting. No trouble with heartburn, dysphagia or odynophagia. No family history of colorectal cancer. She has never had a colonoscopy  Review of Systems  As per history of present illness, otherwise negative   Past Medical History  Diagnosis Date  . Unspecified essential hypertension   . Type II or unspecified type diabetes mellitus without mention of complication, not stated as uncontrolled   . Other and unspecified hyperlipidemia   . Unspecified hypothyroidism   . Anxiety   . Heart murmur   . Hypokalemia   . Atrial fibrillation   . Anemia   . Anxiety     Current Outpatient Prescriptions  Medication Sig Dispense Refill  . ALPRAZolam (XANAX) 0.25 MG tablet 0.25 mg as needed.       . chlorthalidone (HYGROTON) 25 MG tablet Take 1 tablet (25 mg total) by mouth daily.  30 tablet  6  . diltiazem (CARDIZEM CD) 360 MG 24 hr capsule Take 1 capsule (360 mg total) by mouth daily.  30 capsule  6  . ferrous sulfate 325 (65 FE) MG tablet Take 325 mg by mouth 2 (two) times daily.      . fish oil-omega-3 fatty acids 1000 MG capsule Take 1 g by mouth daily.      .  hydrALAZINE (APRESOLINE) 100 MG tablet Take 100 mg by mouth 2 (two) times daily.      . insulin lispro protamine-insulin lispro (HUMALOG 75/25) (75-25) 100 UNIT/ML SUSP Inject 20 Units into the skin 2 (two) times daily with a meal.      . levothyroxine (SYNTHROID, LEVOTHROID) 50 MCG tablet Take 50 mcg by mouth daily.      Marland Kitchen lisinopril (PRINIVIL,ZESTRIL) 20 MG tablet Take 1 tablet (20 mg total) by mouth 2 (two) times daily.  60 tablet  6  . metFORMIN (GLUCOPHAGE) 500 MG tablet Take 500 mg by mouth 2 (two) times daily with a meal.       . metoprolol (TOPROL-XL) 200 MG 24 hr tablet Take 200 mg by mouth daily.      . potassium chloride SA (K-DUR,KLOR-CON) 20 MEQ tablet Take 1 tablet (20 mEq total) by mouth 2 (two) times daily.  60 tablet  6  . pravastatin (PRAVACHOL) 20 MG tablet Take 1 tablet by mouth Daily.      . Rivaroxaban (XARELTO) 20 MG TABS Take 1 tablet (20 mg total) by mouth daily.  30 tablet  6  . peg 3350 powder (MOVIPREP) 100 G SOLR Take 1 kit (100 g total) by mouth once.  1 kit  0   No current facility-administered medications for this visit.    Allergies  Allergen Reactions  . Codeine Phosphate Nausea Only    Family  History  Problem Relation Age of Onset  . Cancer Sister     sarcoma  . Diabetes Sister   . Diabetes Brother     x 2    History  Substance Use Topics  . Smoking status: Former Smoker    Types: Cigarettes    Quit date: 07/10/1983  . Smokeless tobacco: Never Used  . Alcohol Use: No    OBJECTIVE: BP 152/60  Pulse 76  Ht 5' 7.75" (1.721 m)  Wt 174 lb (78.926 kg)  BMI 26.65 kg/m2  LMP 01/07/2011 Constitutional: Well-developed and well-nourished. No distress. HEENT: Normocephalic and atraumatic. Oropharynx is clear and moist. No oropharyngeal exudate. Conjunctivae are normal. No scleral icterus. Neck: Neck supple. Trachea midline. Cardiovascular: Normal rate, regular rhythm and intact distal pulses. Pulmonary/chest: Effort normal and breath sounds  normal. No wheezing, rales or rhonchi. Abdominal: Soft, nontender, nondistended. Bowel sounds active throughout. There are no masses palpable. No hepatosplenomegaly. Extremities: no clubbing, cyanosis, or edema Lymphadenopathy: No cervical adenopathy noted. Neurological: Alert and oriented to person place and time. Skin: Skin is warm and dry. No rashes noted. Psychiatric: Normal mood and affect. Behavior is normal.  Labs  CBC    Component Value Date/Time   WBC 7.6 12/29/2011 0215   RBC 4.86 12/29/2011 0215   HGB 15.0 12/29/2011 0226   HCT 44.0 12/29/2011 0226   PLT 267 12/29/2011 0215   MCV 82.9 12/29/2011 0215   MCH 27.2 12/29/2011 0215   MCHC 32.8 12/29/2011 0215   RDW 15.2 12/29/2011 0215    ASSESSMENT AND PLAN:  57 year old female with a past medical history atrial fibrillation on Xarelto, diabetes, hypothyroidism, hypertension, chronic [place]-standing anemia who seen in consultation at the request of Dr. Shirlee Latch for evaluation screening colonoscopy.  1.  Screening colonoscopy -- the patient is appropriate for screening colonoscopy. We discussed the test today including the risks and benefits and she is agreeable to proceed. Dr. Shirlee Latch in his last office note date permission to hold her anticoagulation for this colonoscopy without the need for a bridge. Therefore we will ask that she stop Xarelto 48 hours before colonoscopy. We will resume the drug after the procedure to be determined on procedure results/need for polypectomy.    2.  Chronic anticoagulation -- see #1

## 2012-09-18 ENCOUNTER — Telehealth: Payer: Self-pay | Admitting: Internal Medicine

## 2012-09-18 NOTE — Telephone Encounter (Signed)
Spoke with patient and informed that a coupon for a free prep would be left at the front desk for pick up.

## 2012-10-01 ENCOUNTER — Ambulatory Visit (INDEPENDENT_AMBULATORY_CARE_PROVIDER_SITE_OTHER): Payer: BC Managed Care – PPO | Admitting: Nurse Practitioner

## 2012-10-01 ENCOUNTER — Other Ambulatory Visit: Payer: Self-pay | Admitting: Cardiology

## 2012-10-01 ENCOUNTER — Encounter: Payer: Self-pay | Admitting: Nurse Practitioner

## 2012-10-01 VITALS — BP 150/78 | HR 72 | Ht 68.0 in | Wt 178.0 lb

## 2012-10-01 DIAGNOSIS — I10 Essential (primary) hypertension: Secondary | ICD-10-CM

## 2012-10-01 NOTE — Progress Notes (Addendum)
Baillie Mohammad Date of Birth: [birth year] Medical Record #[id number]  History of Present Illness: Ms. Butrum is seen back today for a one month check. She is seen for Dr. Shirlee Latch. She is a 57 year old female with PAF with RVR back in June of 2013. Associated with chest pain and ST depression. Converted with Diltiazem. Remains on chronic anticoagulation. Echo was normal. She underwent a exercise Myoview and developed self limiting VT - reviewed with Dr. 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 This could be suppressed with beta blockers and CCBs - she is on both. Her cardiac MRI was negative for ARVC.   Her other issues include HTN, DM, HLD and hypothyroidism.   Seen a month ago. BP was high. Chlorthalidone was increased along with her potassium.   She comes in today. She is here alone. Doing ok. Feels ok. Will be having her colonoscopy next week. Little anxious about that. No chest pain. Not short of breath. No palpitations. Rhythm has been ok per her report. Tolerating her medicines. Notes that her BP running around 150 at home but she was walking more regularly and saw readings in the 112 to 120 range. She says she avoids salt.   Current Outpatient Prescriptions on File Prior to Visit  Medication Sig Dispense Refill  . ALPRAZolam (XANAX) 0.25 MG tablet 0.25 mg as needed.       . chlorthalidone (HYGROTON) 25 MG tablet Take 1 tablet (25 mg total) by mouth daily.  30 tablet  6  . diltiazem (CARDIZEM CD) 360 MG 24 hr capsule Take 1 capsule (360 mg total) by mouth daily.  30 capsule  6  . ferrous sulfate 325 (65 FE) MG tablet Take 325 mg by mouth 2 (two) times daily.      . fish oil-omega-3 fatty acids 1000 MG capsule Take 1 g by mouth daily.      . hydrALAZINE (APRESOLINE) 100 MG tablet Take 100 mg by mouth 2 (two) times daily.      . insulin lispro protamine-insulin lispro (HUMALOG 75/25) (75-25) 100 UNIT/ML SUSP Inject 20 Units into the skin 2 (two)  times daily with a meal.      . levothyroxine (SYNTHROID, LEVOTHROID) 50 MCG tablet Take 50 mcg by mouth daily.      Marland Kitchen lisinopril (PRINIVIL,ZESTRIL) 20 MG tablet Take 1 tablet (20 mg total) by mouth 2 (two) times daily.  60 tablet  6  . metFORMIN (GLUCOPHAGE) 500 MG tablet Take 500 mg by mouth 2 (two) times daily with a meal.       . metoprolol (TOPROL-XL) 200 MG 24 hr tablet Take 200 mg by mouth daily.      . peg 3350 powder (MOVIPREP) 100 G SOLR Take 1 kit (100 g total) by mouth once.  1 kit  0  . potassium chloride SA (K-DUR,KLOR-CON) 20 MEQ tablet Take 1 tablet (20 mEq total) by mouth 2 (two) times daily.  60 tablet  6  . pravastatin (PRAVACHOL) 20 MG tablet Take 1 tablet by mouth Daily.      . Rivaroxaban (XARELTO) 20 MG TABS Take 1 tablet (20 mg total) by mouth daily.  30 tablet  6   No current facility-administered medications on file prior to visit.    Allergies  Allergen Reactions  . Codeine Phosphate Nausea Only    PMH: 1. DM  2. HTN  3. Hypothyroidism  4. Hyperlipidemia  5. Atrial fibrillation: Paroxysmal. Admitted 6/13 with  afib/RVR, back to NSR on diltiazem. Echo (7/13): EF 55-60%, mild MR, normal RV.  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 Noted during ETT in 7/13. Cardiac MRI (7/13): EF 74%, normal RV size and systolic function with no evidence for ARVC, mild MR, no delayed enhancement.  7. 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    Past Medical History  Diagnosis Date  . Unspecified essential hypertension   . Type II or unspecified type diabetes mellitus without mention of complication, not stated as uncontrolled   . Other and unspecified hyperlipidemia   . Unspecified hypothyroidism   . Anxiety   . Heart murmur   . Hypokalemia   . Atrial fibrillation   . Anemia   . Anxiety     Past Surgical History  Procedure Laterality Date  . Eye surgery Left      History  Smoking status  . Former Smoker  . Types: Cigarettes  . Quit date: 07/10/1983  Smokeless tobacco  . Never Used    History  Alcohol Use No    Family History  Problem Relation Age of Onset  . Cancer Sister     sarcoma  . Diabetes Sister   . Diabetes Brother     x 2    Review of Systems: The review of systems is per the HPI.  All other systems were reviewed and are negative.  Physical Exam: BP 150/78  Pulse 72  Ht 5\' 8"  (1.727 m)  Wt 178 lb (80.74 kg)  BMI 27.07 kg/m2  LMP 01/07/2011 Patient is very pleasant and in no acute distress. Skin is warm and dry. Color is normal.  HEENT is unremarkable. Normocephalic/atraumatic. PERRL. Sclera are nonicteric. Neck is supple. No masses. No JVD. Lungs are clear. Cardiac exam shows a regular rate and rhythm. Soft systolic murmur noted.  Abdomen is soft. Extremities are without edema. Gait and ROM are intact. No gross neurologic deficits noted.   LABORATORY DATA:  Lab Results  Component Value Date   WBC 7.6 12/29/2011   HGB 15.0 12/29/2011   HCT 44.0 12/29/2011   PLT 267 12/29/2011   GLUCOSE 115* 09/17/2012   CHOL 115 12/29/2011   TRIG 118 12/29/2011   HDL 33* 12/29/2011   LDLCALC 58 12/29/2011   NA 137 09/17/2012   K 3.9 09/17/2012   CL 103 09/17/2012   CREATININE 1.0 09/17/2012   BUN 15 09/17/2012   CO2 25 09/17/2012   TSH 2.99 04/03/2012   INR 0.83 12/29/2011   HGBA1C 6.5* 12/29/2011    Assessment / Plan: 1. HTN - blood pressure not at goal. She is on multiple medicines and is not eager to be on more. She would like a month to get back into her walking routine. This did help bring her readings down. She is to monitor at home. I will see her back in a month. May have to increase the Hydralazine to 100 mg TID if no improvement.   2. PAF - remains on anticoagulation  3. VT - suspicious for RVOT VT - negative Myoview for ischemia and negative cardiac MRI for ARVC - no reports of recurrence.   Patient is agreeable to this  plan and will call if any problems develop in the interim.   Rosalio Macadamia, RN, ANP-C [facility] [address] [place], [place]  [address]

## 2012-10-01 NOTE — Patient Instructions (Addendum)
Try to get back into your walking routine  Monitor your blood pressure closely for the next month  I will see you back in a month on a day that Dr. Shirlee Latch is here  Bring your blood pressure readings with you that day for me to review  For now, stay on your current regimen.  Call the [facility] office at [phone] if you have any questions, problems or concerns.

## 2012-10-07 ENCOUNTER — Other Ambulatory Visit: Payer: Self-pay | Admitting: Internal Medicine

## 2012-10-07 ENCOUNTER — Encounter: Payer: Self-pay | Admitting: Internal Medicine

## 2012-10-07 ENCOUNTER — Ambulatory Visit (AMBULATORY_SURGERY_CENTER): Payer: BC Managed Care – PPO | Admitting: Internal Medicine

## 2012-10-07 VITALS — BP 139/78 | HR 63 | Temp 98.6°F | Resp 26 | Ht 67.0 in | Wt 174.0 lb

## 2012-10-07 DIAGNOSIS — D126 Benign neoplasm of colon, unspecified: Secondary | ICD-10-CM

## 2012-10-07 DIAGNOSIS — Z1211 Encounter for screening for malignant neoplasm of colon: Secondary | ICD-10-CM

## 2012-10-07 LAB — GLUCOSE, CAPILLARY
Glucose-Capillary: 98 mg/dL (ref 70–99)
Glucose-Capillary: 202 mg/dL — ABNORMAL HIGH (ref 70–99)

## 2012-10-07 MED ORDER — DEXTROSE 5 % IV SOLN: INTRAVENOUS | Status: DC

## 2012-10-07 NOTE — Patient Instructions (Addendum)
YOU HAD AN ENDOSCOPIC PROCEDURE TODAY AT THE [facility]: Refer to the procedure report that was given to you for any specific questions about what was found during the examination.  If the procedure report does not answer your questions, please call your gastroenterologist to clarify.  If you requested that your care partner not be given the details of your procedure findings, then the procedure report has been included in a sealed envelope for you to review at your convenience later.  YOU SHOULD EXPECT: Some feelings of bloating in the abdomen. Passage of more gas than usual.  Walking can help get rid of the air that was put into your GI tract during the procedure and reduce the bloating. If you had a lower endoscopy (such as a colonoscopy or flexible sigmoidoscopy) you may notice spotting of blood in your stool or on the toilet paper. If you underwent a bowel prep for your procedure, then you may not have a normal bowel movement for a few days.  DIET: Your first meal following the procedure should be a light meal and then it is ok to progress to your normal diet.  A half-sandwich or bowl of soup is an example of a good first meal.  Heavy or fried foods are harder to digest and may make you feel nauseous or bloated.  Likewise meals heavy in dairy and vegetables can cause extra gas to form and this can also increase the bloating.  Drink plenty of fluids but you should avoid alcoholic beverages for 24 hours.  ACTIVITY: Your care partner should take you home directly after the procedure.  You should plan to take it easy, moving slowly for the rest of the day.  You can resume normal activity the day after the procedure however you should NOT DRIVE or use heavy machinery for 24 hours (because of the sedation medicines used during the test).    SYMPTOMS TO REPORT IMMEDIATELY: A gastroenterologist can be reached at any hour.  During normal business hours, 8:30 AM to 5:00 PM Monday through Friday,  call [phone].  After hours and on weekends, please call the GI answering service at [phone] emergency number  who will take a message and have the physician on call contact you.   Following lower endoscopy (colonoscopy or flexible sigmoidoscopy):  Excessive amounts of blood in the stool  Significant tenderness or worsening of abdominal pains  Swelling of the abdomen that is new, acute  Fever of 100F or higher  FOLLOW UP: If any biopsies were taken you will be contacted by phone or by letter within the next 1-3 weeks.  Call your gastroenterologist if you have not heard about the biopsies in 3 weeks.  Our staff will call the home number listed on your records the next business day following your procedure to check on you and address any questions or concerns that you may have at that time regarding the information given to you following your procedure. This is a courtesy call and so if there is no answer at the home number and we have not heard from you through the emergency physician on call, we will assume that you have returned to your regular daily activities without incident.  SIGNATURES/CONFIDENTIALITY: You and/or your care partner have signed paperwork which will be entered into your electronic medical record.  These signatures attest to the fact that that the information above on your After Visit Summary has been reviewed and is understood.  Full responsibility of the   confidentiality of this discharge information lies with you and/or your care-partner.  Resume anticoagualant xarelto  10-09-12 Thursday per dr pyrtle   hold aspirin, all aspirin products, and all anti inflammatory medicines like motrin, advil, aleve, goody's powders for 1 week. May use tylenol for pain. You may resume these other medicines 10-14-12.

## 2012-10-07 NOTE — Op Note (Signed)
[facility] [address] [place], [address]   COLONOSCOPY PROCEDURE REPORT  PATIENT: Alison, Porter  MR#: [id number] BIRTHDATE: 23-Aug-1955 , 56  yrs. old GENDER: Female ENDOSCOPIST: Beverley Fiedler, MD REFERRED XB:JYNWGN, [place]. PROCEDURE DATE:  10/07/2012 PROCEDURE:   Colonoscopy with snare polypectomy and Colonoscopy with biopsy ASA CLASS:   Class III INDICATIONS:average risk screening and first colonoscopy. MEDICATIONS: MAC sedation, administered by CRNA and propofol (Diprivan) 300mg  IV  DESCRIPTION OF PROCEDURE:   After the risks benefits and alternatives of the procedure were thoroughly explained, informed consent was obtained.  A digital rectal exam revealed no rectal mass.   The LB [id number]  endoscope was introduced through the anus and advanced to the terminal ileum which was intubated for a short distance. No adverse events experienced.   The quality of the prep was good, using MoviPrep  The instrument was then slowly withdrawn as the colon was fully examined.     COLON FINDINGS: The mucosa appeared normal in the terminal ileum. 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  The opening was medium sized.   A flat and irregular polyp measuring approximately 20 mm in size was found at the cecum.  This lesion is characterized by irregular borders and is laterally spreading.  Multiple biopsies of the lesion were performed using cold forceps.   Two sessile polyps measuring 4-6 mm in size were found in the ascending colon. Polypectomy was performed using cold snare.  All resections were complete and all polyp tissue was completely retrieved.   A sessile polyp measuring 4 mm in size was found in the rectosigmoid colon. A polypectomy was performed with a cold snare.  The resection was complete and the polyp tissue was completely retrieved. Retroflexed views revealed external hemorrhoids. The time to cecum=3 minutes 56 seconds.  Withdrawal time=16  minutes 38 seconds. The scope was withdrawn and the procedure completed.  COMPLICATIONS: There were no complications.  ENDOSCOPIC IMPRESSION: 1.   Normal mucosa in the terminal ileum 2.   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   Flat polyp measuring 20 mm in size was found at the cecum; multiple biopsies of the lesion were performed 4.   Two sessile polyps measuring 4-6 mm in size were found in the ascending colon; Polypectomy was performed using cold snare 5.   Sessile polyp measuring 4 mm in size was found in the rectosigmoid colon; polypectomy was performed with a cold snare 6.   Small external hemorrhoids  RECOMMENDATIONS: 1.  Await pathology results 2.  Hold aspirin, aspirin products, and anti-inflammatory medication for 1 week. 3.  Would resume Xarelto on Thursday, 10/10/1938 4.  Timing of repeat colonoscopy will be determined by pathology findings. 5.  You will receive a letter within 1-2 weeks with the results of your biopsy as well as final recommendations.  Please call my office if you have not received a letter after 3 weeks.   eSigned:  Beverley Fiedler, MD 10/07/2012 2:32 PM   cc: The Patient and Melba Coon MD   PATIENT NAME:  Alison, Porter MR#: [id number]

## 2012-10-07 NOTE — Progress Notes (Signed)
Patient did not experience any of the following events: a burn prior to discharge; a fall within the facility; wrong site/side/patient/procedure/implant event; or a hospital transfer or hospital admission upon discharge from the facility. (G8907)Patient did not have preoperative order for IV antibiotic SSI prophylaxis. (G8918) ewm 

## 2012-10-08 ENCOUNTER — Telehealth: Payer: Self-pay | Admitting: *Deleted

## 2012-10-08 NOTE — Telephone Encounter (Signed)
  Follow up Call-  Call back number 10/07/2012  Post procedure Call Back phone  # [phone]  Permission to leave phone message No  comments pt. does not have voice mail set up     Patient questions:  Do you have a fever, pain , or abdominal swelling? no Pain Score  0 *  Have you tolerated food without any problems? yes  Have you been able to return to your normal activities? yes  Do you have any questions about your discharge instructions: Diet   no Medications  no Follow up visit  no  Do you have questions or concerns about your Care? no  Actions: * If pain score is 4 or above: No action needed, pain <4.  Patient called back related to she noticed a missed call.

## 2012-10-08 NOTE — Telephone Encounter (Signed)
No answer, voice mail box not set up, unable to leave message for patient.

## 2012-10-13 ENCOUNTER — Telehealth: Payer: Self-pay | Admitting: Internal Medicine

## 2012-10-13 NOTE — Telephone Encounter (Signed)
Pt reports being constipated and probably impacted since her COLON on 10/07/12.  She had a small BM, but knows she's not empty. Instructed pt to take up to 3 does of Miralax daily until she has a good BM, then use Miralax one or twice daily until regular; please call if not better. Pt stated understanding.

## 2012-10-14 ENCOUNTER — Encounter: Payer: Self-pay | Admitting: Internal Medicine

## 2012-10-23 ENCOUNTER — Other Ambulatory Visit: Payer: Self-pay | Admitting: Cardiology

## 2012-10-29 ENCOUNTER — Ambulatory Visit: Payer: BC Managed Care – PPO | Admitting: Nurse Practitioner

## 2012-11-03 ENCOUNTER — Ambulatory Visit: Payer: BC Managed Care – PPO | Admitting: Nurse Practitioner

## 2012-11-04 ENCOUNTER — Other Ambulatory Visit: Payer: Self-pay | Admitting: Cardiology

## 2012-12-03 ENCOUNTER — Encounter (HOSPITAL_COMMUNITY): Payer: Self-pay

## 2012-12-03 ENCOUNTER — Emergency Department (HOSPITAL_COMMUNITY)
Admission: EM | Admit: 2012-12-03 | Discharge: 2012-12-03 | Disposition: A | Discharge status: Home / Self Care | Payer: BC Managed Care – PPO | Attending: Emergency Medicine | Admitting: Emergency Medicine

## 2012-12-03 DIAGNOSIS — Z794 Long term (current) use of insulin: Secondary | ICD-10-CM | POA: Insufficient documentation

## 2012-12-03 DIAGNOSIS — K1370 Unspecified lesions of oral mucosa: Secondary | ICD-10-CM | POA: Insufficient documentation

## 2012-12-03 DIAGNOSIS — T45515A Adverse effect of anticoagulants, initial encounter: Secondary | ICD-10-CM | POA: Insufficient documentation

## 2012-12-03 DIAGNOSIS — Z8639 Personal history of other endocrine, nutritional and metabolic disease: Secondary | ICD-10-CM | POA: Insufficient documentation

## 2012-12-03 DIAGNOSIS — R58 Hemorrhage, not elsewhere classified: Secondary | ICD-10-CM

## 2012-12-03 DIAGNOSIS — Z79899 Other long term (current) drug therapy: Secondary | ICD-10-CM | POA: Insufficient documentation

## 2012-12-03 DIAGNOSIS — Z87891 Personal history of nicotine dependence: Secondary | ICD-10-CM | POA: Insufficient documentation

## 2012-12-03 DIAGNOSIS — K625 Hemorrhage of anus and rectum: Secondary | ICD-10-CM | POA: Insufficient documentation

## 2012-12-03 DIAGNOSIS — Z7901 Long term (current) use of anticoagulants: Secondary | ICD-10-CM | POA: Insufficient documentation

## 2012-12-03 DIAGNOSIS — D649 Anemia, unspecified: Secondary | ICD-10-CM | POA: Insufficient documentation

## 2012-12-03 DIAGNOSIS — R011 Cardiac murmur, unspecified: Secondary | ICD-10-CM | POA: Insufficient documentation

## 2012-12-03 DIAGNOSIS — E039 Hypothyroidism, unspecified: Secondary | ICD-10-CM | POA: Insufficient documentation

## 2012-12-03 DIAGNOSIS — I4891 Unspecified atrial fibrillation: Secondary | ICD-10-CM | POA: Insufficient documentation

## 2012-12-03 DIAGNOSIS — Z862 Personal history of diseases of the blood and blood-forming organs and certain disorders involving the immune mechanism: Secondary | ICD-10-CM | POA: Insufficient documentation

## 2012-12-03 DIAGNOSIS — E785 Hyperlipidemia, unspecified: Secondary | ICD-10-CM | POA: Insufficient documentation

## 2012-12-03 DIAGNOSIS — I10 Essential (primary) hypertension: Secondary | ICD-10-CM | POA: Insufficient documentation

## 2012-12-03 DIAGNOSIS — K644 Residual hemorrhoidal skin tags: Secondary | ICD-10-CM | POA: Insufficient documentation

## 2012-12-03 DIAGNOSIS — E119 Type 2 diabetes mellitus without complications: Secondary | ICD-10-CM | POA: Insufficient documentation

## 2012-12-03 DIAGNOSIS — F411 Generalized anxiety disorder: Secondary | ICD-10-CM | POA: Insufficient documentation

## 2012-12-03 LAB — CBC WITH DIFFERENTIAL/PLATELET
Basophils Absolute: 0.1 10*3/uL (ref 0.0–0.1)
Basophils Relative: 1 % (ref 0–1)
Eosinophils Absolute: 0.3 10*3/uL (ref 0.0–0.7)
Eosinophils Relative: 4 % (ref 0–5)
HCT: 38 % (ref 36.0–46.0)
Hemoglobin: 11.9 g/dL — ABNORMAL LOW (ref 12.0–15.0)
Lymphocytes Relative: 24 % (ref 12–46)
Lymphs Abs: 1.6 10*3/uL (ref 0.7–4.0)
MCH: 26.4 pg (ref 26.0–34.0)
MCHC: 31.3 g/dL (ref 30.0–36.0)
MCV: 84.4 fL (ref 78.0–100.0)
Monocytes Absolute: 0.5 10*3/uL (ref 0.1–1.0)
Monocytes Relative: 8 % (ref 3–12)
Neutro Abs: 4.3 10*3/uL (ref 1.7–7.7)
Neutrophils Relative %: 64 % (ref 43–77)
Platelets: 328 10*3/uL (ref 150–400)
RBC: 4.5 MIL/uL (ref 3.87–5.11)
RDW: 14 % (ref 11.5–15.5)
WBC: 6.7 10*3/uL (ref 4.0–10.5)

## 2012-12-03 LAB — PROTIME-INR
INR: 1.59 — ABNORMAL HIGH (ref 0.00–1.49)
Prothrombin Time: 18.5 seconds — ABNORMAL HIGH (ref 11.6–15.2)

## 2012-12-03 LAB — OCCULT BLOOD, POC DEVICE: Fecal Occult Bld: NEGATIVE

## 2012-12-03 LAB — APTT: aPTT: 53 seconds — ABNORMAL HIGH (ref 24–37)

## 2012-12-03 NOTE — ED Notes (Signed)
Patient reports she noticed some bleeding from the right upper jaw line today, then later in afternoon had a couple small drops of blood on the floor when she went to the bathroom and noticed she had a smear of blood when she wiped, unsure if it is rectal or vaginal. Patient states she was concerned because of taking blood thinners (xerlto). Patient accompanied by daughter in law at bedside. Patient denies pain, outside of a "very slight headache, that's not enough to mention".

## 2012-12-03 NOTE — ED Provider Notes (Signed)
History     CSN: [id number]  Arrival date & time 12/03/12  2037   First MD Initiated Contact with Patient 12/03/12 2055      Chief Complaint  Patient presents with  . Coagulation Disorder    (Consider location/radiation/quality/duration/timing/severity/associated sxs/prior treatment) The history is provided by the patient.   patient presents after some blood came from mouth. She thinks it may have come from a crack by a tooth on her gums. She states she also had some blood in the toilet and on the toilet paper. She states was a couple of drops and fell and some blood. She thinks it could have come from a hemorrhoid or rectally. She is on Zaroxolyn for atrial fibrillation no other bleeding. The lightheadedness or dizziness. No easy bruising, besides what is normal for her  Past Medical History  Diagnosis Date  . Unspecified essential hypertension   . Type II or unspecified type diabetes mellitus without mention of complication, not stated as uncontrolled   . Other and unspecified hyperlipidemia   . Unspecified hypothyroidism   . Anxiety   . Heart murmur   . Hypokalemia   . Atrial fibrillation   . Anemia   . Anxiety     Past Surgical History  Procedure Laterality Date  . Eye surgery Left     Family History  Problem Relation Age of Onset  . Cancer Sister     sarcoma  . Diabetes Sister   . Diabetes Brother     x 2    History  Substance Use Topics  . Smoking status: Former Smoker    Types: Cigarettes    Quit date: 07/10/1983  . Smokeless tobacco: Never Used  . Alcohol Use: No    OB History   Grav Para Term Preterm Abortions TAB SAB Ect Mult Living                  Review of Systems  Constitutional: Negative for activity change and appetite change.  HENT: Negative for neck stiffness.   Eyes: Negative for pain.  Respiratory: Negative for chest tightness and shortness of breath.   Cardiovascular: Negative for chest pain and leg swelling.  Gastrointestinal:  Positive for anal bleeding. Negative for nausea, vomiting, abdominal pain, diarrhea, constipation, blood in stool and rectal pain.  Genitourinary: Negative for flank pain.  Musculoskeletal: Negative for back pain.  Skin: Negative for rash.  Neurological: Negative for weakness, numbness and headaches.  Psychiatric/Behavioral: Negative for behavioral problems.    Allergies  Codeine phosphate  Home Medications   Current Outpatient Rx  Name  Route  Sig  Dispense  Refill  . ALPRAZolam (XANAX) 0.25 MG tablet   Oral   Take 0.25 mg by mouth 3 (three) times daily as needed for anxiety.          . chlorthalidone (HYGROTON) 25 MG tablet   Oral   Take 1 tablet (25 mg total) by mouth daily.   30 tablet   6   . diltiazem (CARDIZEM CD) 360 MG 24 hr capsule   Oral   Take 360 mg by mouth daily.         . ferrous sulfate 325 (65 FE) MG tablet   Oral   Take 325 mg by mouth 2 (two) times daily.         . hydrALAZINE (APRESOLINE) 100 MG tablet   Oral   Take 100 mg by mouth 2 (two) times daily.         Marland Kitchen  insulin lispro protamine-insulin lispro (HUMALOG 75/25) (75-25) 100 UNIT/ML SUSP   Subcutaneous   Inject 20 Units into the skin 2 (two) times daily with a meal.         . levothyroxine (SYNTHROID, LEVOTHROID) 50 MCG tablet   Oral   Take 50 mcg by mouth daily.         Marland Kitchen lisinopril (PRINIVIL,ZESTRIL) 20 MG tablet   Oral   Take 1 tablet (20 mg total) by mouth 2 (two) times daily.   60 tablet   6   . metFORMIN (GLUCOPHAGE) 500 MG tablet   Oral   Take 500 mg by mouth 2 (two) times daily with a meal.          . metoprolol (TOPROL-XL) 200 MG 24 hr tablet   Oral   Take 200 mg by mouth daily.         Marland Kitchen omega-3 acid ethyl esters (LOVAZA) 1 G capsule   Oral   Take 1 g by mouth daily.         . potassium chloride SA (K-DUR,KLOR-CON) 20 MEQ tablet   Oral   Take 1 tablet (20 mEq total) by mouth 2 (two) times daily.   60 tablet   6   . pravastatin (PRAVACHOL) 20 MG  tablet   Oral   Take 1 tablet by mouth at bedtime.          . Rivaroxaban (XARELTO) 20 MG TABS   Oral   Take 20 mg by mouth daily.           BP 179/75  Pulse 66  Temp(Src) 98.6 F (37 C) (Oral)  Resp 20  Ht 5\' 8"  (1.727 m)  Wt 172 lb (78.019 kg)  BMI 26.16 kg/m2  SpO2 98%  LMP 11/10/2012  Physical Exam  Nursing note and vitals reviewed. Constitutional: She is oriented to person, place, and time. She appears well-developed and well-nourished.  HENT:  Head: Normocephalic and atraumatic.  Area on anterior gum of possible previous bleeding. No active bleeding.  Eyes: EOM are normal. Pupils are equal, round, and reactive to light.  Neck: Normal range of motion. Neck supple.  Cardiovascular: Normal rate, regular rhythm and normal heart sounds.   No murmur heard. Pulmonary/Chest: Effort normal and breath sounds normal. No respiratory distress. She has no wheezes. She has no rales.  Abdominal: Soft. Bowel sounds are normal. She exhibits no distension. There is no tenderness. There is no rebound and no guarding.  Genitourinary: Guaiac negative stool.  External hemorrhoids without signs of bleeding. Brown stool. No masses palpated  Musculoskeletal: Normal range of motion.  Neurological: She is alert and oriented to person, place, and time. No cranial nerve deficit.  Skin: Skin is warm and dry.  Psychiatric: She has a normal mood and affect. Her speech is normal.    ED Course  Procedures (including critical care time)  Labs Reviewed  CBC WITH DIFFERENTIAL - Abnormal; Notable for the following:    Hemoglobin 11.9 (*)    All other components within normal limits  PROTIME-INR - Abnormal; Notable for the following:    Prothrombin Time 18.5 (*)    INR 1.59 (*)    All other components within normal limits  APTT - Abnormal; Notable for the following:    aPTT 53 (*)    All other components within normal limits  OCCULT BLOOD, POC DEVICE   No results found.   1. Bleeding        MDM  Patient  with possible bleeding. Is on Xarelto. Hemoglobin is 11.9. She had a previous hemoglobin of 13. This will need to be rechecked. INR is not extremely elevated. She will followup for recheck and return for increasing bleeding, lightheadedness or dizziness.        Juliet Rude. Rubin Payor, MD 12/03/12 2149

## 2012-12-03 NOTE — ED Notes (Signed)
Pt states that she tasted and noticed blood from a tooth today, then she went to the bathroom and had blood when she wiped, not sure if it was rectal or not, takes xarelto

## 2012-12-03 NOTE — ED Notes (Signed)
Occult blood examine chaperoned by A. Ward, RN.

## 2012-12-03 NOTE — ED Notes (Signed)
MD at bedside. 

## 2012-12-17 ENCOUNTER — Encounter: Payer: Self-pay | Admitting: Internal Medicine

## 2012-12-23 ENCOUNTER — Ambulatory Visit: Payer: BC Managed Care – PPO | Admitting: Cardiology

## 2012-12-31 ENCOUNTER — Ambulatory Visit: Payer: BC Managed Care – PPO | Admitting: Cardiology

## 2013-01-05 ENCOUNTER — Encounter: Payer: Self-pay | Admitting: Nurse Practitioner

## 2013-01-05 ENCOUNTER — Ambulatory Visit (INDEPENDENT_AMBULATORY_CARE_PROVIDER_SITE_OTHER): Payer: BC Managed Care – PPO | Admitting: Nurse Practitioner

## 2013-01-05 VITALS — BP 140/72 | HR 68 | Ht 68.0 in | Wt 169.0 lb

## 2013-01-05 DIAGNOSIS — I10 Essential (primary) hypertension: Secondary | ICD-10-CM

## 2013-01-05 NOTE — Patient Instructions (Addendum)
Ithink you are doing well.  Keep up the good work!!  Stay on your current medicines  See Dr. Shirlee Latch in 6 months  Call the [facility] office at [phone] if you have any questions, problems or concerns.

## 2013-01-05 NOTE — Progress Notes (Signed)
Alison Porter Date of Birth: 12-20-1955 Medical Record #[id number]  History of Present Illness: Alison Porter is seen back today for a 3 month check. She is seen for Dr. Shirlee Latch. She has PAF and had AF with RVR in June of 2013. This was associated with chest pain and ST depression. Converted with Diltiazem. Remains on chronic anticoagulation. Echo was normal. A exercise Myoview was performed and demonstrated self limiting VT - reviewed by Dr. 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 It was felt to best be managed medically with suppression thru beta blocker and CCBs - she is on both. Had negative cardiac MRI for ARVC.   Her other issues include HTN, DM, HLD and hypothyroidism.   I saw her back in March. BP was still up but she really did not want any more medicines and wanted to try and work on her lifestyle.   Comes back today. She is here alone. Doing very well. She is exercising and working on her diet. Weight is down 9 pounds. She is on the bike and the treadmill. No chest pain. Not short of breath. Not dizzy or lightheaded. Tolerating her medicines. She is seeing Dr. Clovis Riley soon about her diabetes. She is having more spells of her sugar dropping.   Current Outpatient Prescriptions  Medication Sig Dispense Refill  . ALPRAZolam (XANAX) 0.25 MG tablet Take 0.25 mg by mouth 3 (three) times daily as needed for anxiety.       . chlorthalidone (HYGROTON) 25 MG tablet Take 1 tablet (25 mg total) by mouth daily.  30 tablet  6  . diltiazem (CARDIZEM CD) 360 MG 24 hr capsule Take 360 mg by mouth daily.      . ferrous sulfate 325 (65 FE) MG tablet Take 325 mg by mouth 2 (two) times daily.      . hydrALAZINE (APRESOLINE) 100 MG tablet Take 100 mg by mouth 2 (two) times daily.      . insulin lispro protamine-insulin lispro (HUMALOG 75/25) (75-25) 100 UNIT/ML SUSP Inject 20 Units into the skin 2 (two) times daily with a meal.      . levothyroxine (SYNTHROID, LEVOTHROID) 50  MCG tablet Take 50 mcg by mouth daily.      Marland Kitchen lisinopril (PRINIVIL,ZESTRIL) 20 MG tablet Take 1 tablet (20 mg total) by mouth 2 (two) times daily.  60 tablet  6  . metFORMIN (GLUCOPHAGE) 500 MG tablet Take 500 mg by mouth 2 (two) times daily with a meal.       . metoprolol (TOPROL-XL) 200 MG 24 hr tablet Take 200 mg by mouth daily.      Marland Kitchen omega-3 acid ethyl esters (LOVAZA) 1 G capsule Take 1 g by mouth daily.      . potassium chloride SA (K-DUR,KLOR-CON) 20 MEQ tablet Take 1 tablet (20 mEq total) by mouth 2 (two) times daily.  60 tablet  6  . pravastatin (PRAVACHOL) 20 MG tablet Take 1 tablet by mouth at bedtime.       . Rivaroxaban (XARELTO) 20 MG TABS Take 20 mg by mouth daily.       No current facility-administered medications for this visit.    Allergies  Allergen Reactions  . Codeine Phosphate Nausea Only    Past Medical History  Diagnosis Date  . Unspecified essential hypertension   . Type II or unspecified type diabetes mellitus without mention of complication, not stated as uncontrolled   . Other and unspecified hyperlipidemia   . Unspecified hypothyroidism   .  Anxiety   . Heart murmur   . Hypokalemia   . Atrial fibrillation   . Anemia   . Anxiety     Past Surgical History  Procedure Laterality Date  . Eye surgery Left     History  Smoking status  . Former Smoker  . Types: Cigarettes  . Quit date: 07/10/1983  Smokeless tobacco  . Never Used    History  Alcohol Use No    Family History  Problem Relation Age of Onset  . Cancer Sister     sarcoma  . Diabetes Sister   . Diabetes Brother     x 2    Review of Systems: The review of systems is per the HPI.  All other systems were reviewed and are negative.  Physical Exam: BP 140/72  Pulse 68  Ht 5\' 8"  (1.727 m)  Wt 169 lb (76.658 kg)  BMI 25.7 kg/m2  LMP 11/10/2012 Patient is very pleasant and in no acute distress. Skin is warm and dry. Color is normal.  HEENT is unremarkable.  Normocephalic/atraumatic. PERRL. Sclera are nonicteric. Neck is supple. No masses. No JVD. Lungs are clear. Cardiac exam shows a regular rate and rhythm. Soft outflow murmur. Abdomen is soft. Extremities are without edema. Gait and ROM are intact. No gross neurologic deficits noted.  LABORATORY DATA:  Lab Results  Component Value Date   WBC 6.7 12/03/2012   HGB 11.9* 12/03/2012   HCT 38.0 12/03/2012   PLT 328 12/03/2012   GLUCOSE 115* 09/17/2012   CHOL 115 12/29/2011   TRIG 118 12/29/2011   HDL 33* 12/29/2011   LDLCALC 58 12/29/2011   NA 137 09/17/2012   K 3.9 09/17/2012   CL 103 09/17/2012   CREATININE 1.0 09/17/2012   BUN 15 09/17/2012   CO2 25 09/17/2012   TSH 2.99 04/03/2012   INR 1.59* 12/03/2012   HGBA1C 6.5* 12/29/2011    Assessment / Plan: 1. HTN - BP is ok. I have left her on her current regimen. She will continue with her lifestyle changes.   2. PAF - remains on anticoagulation with Xarelto - we have samples available for her today.   3. Chronic anticoagulation - no problems noted.   4. VT - suspicious for RVOT VT - negative Myoview for ischemia and negative MRI for ARVC - no reports of recurrence.   5. DM - seeing Dr. Clovis Riley soon for followup.   We will see her back in 6 months.   Patient is agreeable to this plan and will call if any problems develop in the interim.   Rosalio Macadamia, RN, ANP-C  [facility] [address] [place], [place]  [address]

## 2013-01-14 ENCOUNTER — Other Ambulatory Visit: Payer: Self-pay

## 2013-01-14 DIAGNOSIS — Z1231 Encounter for screening mammogram for malignant neoplasm of breast: Secondary | ICD-10-CM

## 2013-01-23 ENCOUNTER — Encounter: Payer: Self-pay | Admitting: Internal Medicine

## 2013-02-16 ENCOUNTER — Ambulatory Visit
Admission: RE | Admit: 2013-02-16 | Discharge: 2013-02-16 | Disposition: A | Discharge status: Home / Self Care | Payer: BC Managed Care – PPO | Source: Ambulatory Visit

## 2013-02-16 DIAGNOSIS — Z1231 Encounter for screening mammogram for malignant neoplasm of breast: Secondary | ICD-10-CM

## 2013-04-01 ENCOUNTER — Other Ambulatory Visit: Payer: Self-pay | Admitting: Cardiology

## 2013-04-02 ENCOUNTER — Ambulatory Visit: Payer: BC Managed Care – PPO | Admitting: Internal Medicine

## 2013-04-11 ENCOUNTER — Other Ambulatory Visit: Payer: Self-pay | Admitting: Cardiology

## 2013-04-21 ENCOUNTER — Other Ambulatory Visit: Payer: Self-pay | Admitting: Cardiology

## 2013-04-29 ENCOUNTER — Other Ambulatory Visit: Payer: Self-pay | Admitting: Cardiology

## 2013-04-29 ENCOUNTER — Other Ambulatory Visit: Payer: Self-pay | Admitting: *Deleted

## 2013-04-29 MED ORDER — DILTIAZEM HCL ER COATED BEADS 360 MG PO CP24: 360.0000 mg | ORAL_CAPSULE | Freq: Every day | ORAL | Status: DC

## 2013-05-05 ENCOUNTER — Ambulatory Visit: Payer: BC Managed Care – PPO | Admitting: Internal Medicine

## 2013-05-14 ENCOUNTER — Other Ambulatory Visit: Payer: Self-pay | Admitting: Cardiology

## 2013-06-01 ENCOUNTER — Other Ambulatory Visit: Payer: Self-pay | Admitting: Cardiology

## 2013-06-01 ENCOUNTER — Ambulatory Visit: Payer: BC Managed Care – PPO | Admitting: Internal Medicine

## 2013-06-15 ENCOUNTER — Other Ambulatory Visit: Payer: Self-pay | Admitting: Cardiovascular Disease

## 2013-06-20 ENCOUNTER — Other Ambulatory Visit: Payer: Self-pay | Admitting: Cardiology

## 2013-07-07 ENCOUNTER — Ambulatory Visit (INDEPENDENT_AMBULATORY_CARE_PROVIDER_SITE_OTHER): Payer: BC Managed Care – PPO | Admitting: Cardiology

## 2013-07-07 ENCOUNTER — Encounter: Payer: Self-pay | Admitting: Cardiology

## 2013-07-07 VITALS — BP 132/72 | HR 63 | Ht 68.0 in | Wt 165.0 lb

## 2013-07-07 DIAGNOSIS — I4891 Unspecified atrial fibrillation: Secondary | ICD-10-CM

## 2013-07-07 DIAGNOSIS — I10 Essential (primary) hypertension: Secondary | ICD-10-CM

## 2013-07-07 DIAGNOSIS — I472 Ventricular tachycardia, unspecified: Secondary | ICD-10-CM

## 2013-07-07 DIAGNOSIS — I4729 Other ventricular tachycardia: Secondary | ICD-10-CM

## 2013-07-07 LAB — CBC WITH DIFFERENTIAL/PLATELET
Basophils Absolute: 0 10*3/uL (ref 0.0–0.1)
Basophils Relative: 0.7 % (ref 0.0–3.0)
Eosinophils Absolute: 0.2 10*3/uL (ref 0.0–0.7)
Eosinophils Relative: 2.8 % (ref 0.0–5.0)
HCT: 39.7 % (ref 36.0–46.0)
Hemoglobin: 12.8 g/dL (ref 12.0–15.0)
Lymphocytes Relative: 30.3 % (ref 12.0–46.0)
Lymphs Abs: 1.8 10*3/uL (ref 0.7–4.0)
MCHC: 32.2 g/dL (ref 30.0–36.0)
MCV: 85 fl (ref 78.0–100.0)
Monocytes Absolute: 0.5 10*3/uL (ref 0.1–1.0)
Monocytes Relative: 8 % (ref 3.0–12.0)
Neutro Abs: 3.5 10*3/uL (ref 1.4–7.7)
Neutrophils Relative %: 58.2 % (ref 43.0–77.0)
Platelets: 319 10*3/uL (ref 150.0–400.0)
RBC: 4.68 Mil/uL (ref 3.87–5.11)
RDW: 16 % — ABNORMAL HIGH (ref 11.5–14.6)
WBC: 5.9 10*3/uL (ref 4.5–10.5)

## 2013-07-07 NOTE — Progress Notes (Signed)
Patient ID: Alison Porter, female   DOB: [birth year], 57 y.o.   MRN: [id number] PCP: Dr. Clovis Riley  57 yo with paroxysmal atrial fibrillation who also had VT during an exercise treadmill test presents for cardiology followup.  She was admitted in 6/13 with atrial fibrillation/RVR that was symptomatic (tachypalpitations).  She had no prior documented atrial fibrillation.  She converted back to NSR after getting diltiazem.  She was started on apixaban and discharged home.  Echo showed normal EF.  Given ST depression and some chest tightness with afib/RVR, I had her do an ETT-myoview.  The myoview showed no evidence for ischemia or infarction, but during the treadmill test she developed self-limited VT with rate in the 160s.  I reviewed the strips with Dr. 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  Cardiac MRI was done showing no evidence for ARVC.   She has been symptomatically stable since last appointment.   About a month ago, she had a run of tachypalpitations lasting for only a few seconds.  There was no associated lightheadedness.  Otherwise, no symptoms concerning for a tachyarrhythmia.  No exertional dyspnea or chest pain.  She continues to lose weight and BP is improved.    ECG: NSR, LAFB, borderline 1st degree AV block  Labs (6/13): K 4.2, creatinine 0.66, LDL 58, HDL 33, TSH 5.8, free T4 normal Labs (8/13): K 3.9, creatinine 0.8 Labs (9/13): TSH normal, K 3.9, creatinine 1.0 Labs (3/14): K 3.9, creatinine 1.0 Labs (5/14): HCT 38  PMH: 1. DM 2. HTN 3. Hypothyroidism 4. Hyperlipidemia 5. Atrial fibrillation: Paroxysmal.  Admitted 6/13 with afib/RVR, back to NSR on diltiazem.  Echo (7/13): EF 55-60%, mild MR, normal RV.  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  Noted during ETT in 7/13.  Cardiac MRI (7/13): EF 74%, normal RV size and systolic function with no evidence for ARVC, mild MR, no delayed enhancement.  7. ETT-myoview (7/13): 3' exercise,  RVOT VT with rate 160s (LBBB/inferior axis) during exercise, EF 78%, no ischemia or infarction.    SH: Single, lives in [place] with daughter.  Unemployed.  Prior smoker, none now.   FH: Sister with CAD diagnosed in her 69s.   Current Outpatient Prescriptions  Medication Sig Dispense Refill  . ALPRAZolam (XANAX) 0.25 MG tablet Take 0.25 mg by mouth 3 (three) times daily as needed for anxiety.       . chlorthalidone (HYGROTON) 25 MG tablet TAKE ONE TABLET BY MOUTH ONCE DAILY  30 tablet  1  . diltiazem (CARDIZEM CD) 360 MG 24 hr capsule Take 1 capsule (360 mg total) by mouth daily.  30 capsule  2  . ferrous sulfate 325 (65 FE) MG tablet Take 325 mg by mouth 2 (two) times daily.      . hydrALAZINE (APRESOLINE) 100 MG tablet Take 100 mg by mouth 2 (two) times daily.      . insulin lispro protamine-insulin lispro (HUMALOG 75/25) (75-25) 100 UNIT/ML SUSP Inject 20 Units into the skin 2 (two) times daily with a meal.      . KLOR-CON M20 20 MEQ tablet TAKE ONE  BY MOUTH TWICE DAILY  60 tablet  0  . levothyroxine (SYNTHROID, LEVOTHROID) 50 MCG tablet Take 50 mcg by mouth daily.      Marland Kitchen lisinopril (PRINIVIL,ZESTRIL) 20 MG tablet TAKE ONE TABLET BY MOUTH TWICE DAILY  60 tablet  0  . metFORMIN (GLUCOPHAGE) 500 MG tablet Take 500 mg by mouth 2 (two) times daily with a  meal.       . metoprolol (TOPROL-XL) 200 MG 24 hr tablet Take 200 mg by mouth daily.      Marland Kitchen omega-3 acid ethyl esters (LOVAZA) 1 G capsule Take 1 g by mouth daily.      . pravastatin (PRAVACHOL) 20 MG tablet Take 1 tablet by mouth at bedtime.       Carlena Hurl 20 MG TABS tablet TAKE ONE TABLET BY MOUTH ONCE DAILY  30 tablet  6   No current facility-administered medications for this visit.    BP 132/72  Pulse 63  Ht 5\' 8"  (1.727 m)  Wt 74.844 kg (165 lb)  BMI 25.09 kg/m2  LMP 11/10/2012 General: NAD Neck: No JVD, no thyromegaly or thyroid nodule.  Lungs: Clear to auscultation bilaterally with normal respiratory effort. CV:  Nondisplaced PMI.  Heart regular S1/S2, no S3/S4, 2/6 early SEM.  No edema.  No carotid bruit.  Normal pedal pulses.  Abdomen: Soft, nontender, no hepatosplenomegaly, no distention.  Neurologic: Alert and oriented x 3.  Psych: Anxious. Extremities: No clubbing or cyanosis.   Assessment/Plan: 1. Atrial fibrillation: Paroxysmal atrial fibrillation. She is in NSR today and has had no recent significant palpitations. Continue Toprol XL and diltiazem. CHADSVASC = 3 (female, HTN, DM). She is now on Xarelto.  BMET and CBC will be checked today given Xarelto use.  2. HTN: BP improved.  I suspect weight loss has helped. 3. 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 VT was seen during ETT, rate was 160 bpm. There was LBBB/inferior axis morphology. Myoview did not show evidence for ischemia or infarction. RVOT VT can be suppressed with beta blockers and CCBs. She is on both. Cardiac MRI and ECG were not suggestive of ARVC.   Marca Ancona 07/07/2013

## 2013-07-07 NOTE — Patient Instructions (Signed)
Your physician recommends that you continue on your current medications as directed. Please refer to the Current Medication list given to you today.  Labs today: Bmet, Cbc  Your physician wants you to follow-up in: 6 months You will receive a reminder letter in the mail two months in advance. If you don't receive a letter, please call our office to schedule the follow-up appointment.

## 2013-07-08 ENCOUNTER — Other Ambulatory Visit: Payer: Self-pay | Admitting: Cardiology

## 2013-07-16 ENCOUNTER — Other Ambulatory Visit: Payer: Self-pay | Admitting: Cardiology

## 2013-07-18 ENCOUNTER — Other Ambulatory Visit: Payer: Self-pay | Admitting: Physician Assistant

## 2013-07-22 ENCOUNTER — Other Ambulatory Visit: Payer: Self-pay | Admitting: Cardiology

## 2013-07-27 ENCOUNTER — Other Ambulatory Visit: Payer: Self-pay | Admitting: Cardiology

## 2013-08-06 ENCOUNTER — Other Ambulatory Visit: Payer: Self-pay | Admitting: Cardiology

## 2013-09-30 ENCOUNTER — Telehealth: Payer: Self-pay | Admitting: Cardiology

## 2013-09-30 NOTE — Telephone Encounter (Signed)
New Message   Pt is requesting a call back to discuss if it is Ok to take an Antibiotic Ciprofloxacin.Marland KitchenMarland KitchenPlease call

## 2013-09-30 NOTE — Telephone Encounter (Signed)
Patient was prescribed cipro for UTI.  She is concerned about an interaction with her other medications.  Spoke with Ysidro Evert, pharmacist and then informed patient--okay to take antibiotic, but separate it from the levothyroxine by 2-3 hours.  Informed patient, she verbalizes understanding.

## 2013-10-02 ENCOUNTER — Encounter: Payer: Self-pay | Admitting: Internal Medicine

## 2013-10-27 ENCOUNTER — Encounter: Payer: Self-pay | Admitting: Internal Medicine

## 2013-11-17 ENCOUNTER — Other Ambulatory Visit: Payer: Self-pay | Admitting: Cardiology

## 2013-11-24 ENCOUNTER — Other Ambulatory Visit: Payer: Self-pay | Admitting: Cardiology

## 2013-11-25 ENCOUNTER — Telehealth: Payer: Self-pay

## 2013-11-25 NOTE — Telephone Encounter (Signed)
Error

## 2013-12-11 ENCOUNTER — Other Ambulatory Visit: Payer: Self-pay | Admitting: Cardiology

## 2013-12-16 ENCOUNTER — Ambulatory Visit: Payer: BC Managed Care – PPO | Admitting: Cardiology

## 2013-12-18 ENCOUNTER — Encounter: Payer: Self-pay | Admitting: Cardiology

## 2013-12-18 ENCOUNTER — Ambulatory Visit (INDEPENDENT_AMBULATORY_CARE_PROVIDER_SITE_OTHER): Payer: BC Managed Care – PPO | Admitting: Cardiology

## 2013-12-18 VITALS — BP 138/70 | HR 69 | Ht 68.0 in | Wt 176.2 lb

## 2013-12-18 DIAGNOSIS — I4891 Unspecified atrial fibrillation: Secondary | ICD-10-CM

## 2013-12-18 DIAGNOSIS — I4729 Other ventricular tachycardia: Secondary | ICD-10-CM

## 2013-12-18 DIAGNOSIS — I10 Essential (primary) hypertension: Secondary | ICD-10-CM

## 2013-12-18 DIAGNOSIS — I472 Ventricular tachycardia, unspecified: Secondary | ICD-10-CM

## 2013-12-18 LAB — BASIC METABOLIC PANEL
BUN: 12 mg/dL (ref 6–23)
CO2: 24 mEq/L (ref 19–32)
Calcium: 9.7 mg/dL (ref 8.4–10.5)
Chloride: 105 mEq/L (ref 96–112)
Creatinine, Ser: 0.8 mg/dL (ref 0.4–1.2)
GFR: 92.5 mL/min (ref >60.00)
Glucose, Bld: 169 mg/dL — ABNORMAL HIGH (ref 70–99)
Potassium: 3.7 mEq/L (ref 3.5–5.1)
Sodium: 138 mEq/L (ref 135–145)

## 2013-12-18 LAB — CBC WITH DIFFERENTIAL/PLATELET
Basophils Absolute: 0 10*3/uL (ref 0.0–0.1)
Basophils Relative: 0.4 % (ref 0.0–3.0)
Eosinophils Absolute: 0.2 10*3/uL (ref 0.0–0.7)
Eosinophils Relative: 2.7 % (ref 0.0–5.0)
HCT: 35.2 % — ABNORMAL LOW (ref 36.0–46.0)
Hemoglobin: 11.4 g/dL — ABNORMAL LOW (ref 12.0–15.0)
Lymphocytes Relative: 18.9 % (ref 12.0–46.0)
Lymphs Abs: 1.2 10*3/uL (ref 0.7–4.0)
MCHC: 32.4 g/dL (ref 30.0–36.0)
MCV: 85.6 fl (ref 78.0–100.0)
Monocytes Absolute: 0.6 10*3/uL (ref 0.1–1.0)
Monocytes Relative: 9.9 % (ref 3.0–12.0)
Neutro Abs: 4.2 10*3/uL (ref 1.4–7.7)
Neutrophils Relative %: 68.1 % (ref 43.0–77.0)
Platelets: 297 10*3/uL (ref 150.0–400.0)
RBC: 4.11 Mil/uL (ref 3.87–5.11)
RDW: 15.9 % — ABNORMAL HIGH (ref 11.5–15.5)
WBC: 6.2 10*3/uL (ref 4.0–10.5)

## 2013-12-18 NOTE — Patient Instructions (Signed)
Your physician recommends that you return for lab work today--BMET/CBCd  Your physician wants you to follow-up in: 6 months with Dr Aundra Dubin. (December 2015).  You will receive a reminder letter in the mail two months in advance. If you don't receive a letter, please call our office to schedule the follow-up appointment.

## 2013-12-20 NOTE — Progress Notes (Signed)
Patient ID: Alison Porter, female   DOB: Feb 20, 1956, 58 y.o.   MRN: [id number] PCP: Dr. Alroy Dust  58 yo with paroxysmal atrial fibrillation who also had VT during an exercise treadmill test presents for cardiology followup.  She was admitted in 6/13 with atrial fibrillation/RVR that was symptomatic (tachypalpitations).  She had no prior documented atrial fibrillation.  She converted back to NSR after getting diltiazem.  She was started on apixaban and discharged home.  Echo showed normal EF.  Given ST depression and some chest tightness with afib/RVR, I had her do an ETT-myoview.  The myoview showed no evidence for ischemia or infarction, but during the treadmill test she developed self-limited VT with rate in the 160s.  I reviewed the strips with Dr. 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  Cardiac MRI was done showing no evidence for ARVC.   She has been symptomatically stable since last appointment.  Since last appointment, she has had a couple of brief runs of tachypalpitations. There was no associated lightheadedness.  No exertional dyspnea or chest pain.  BP is controlled. She is trying to walk 3 times a week.   ECG: NSR, 1st degree AV block  Labs (6/13): K 4.2, creatinine 0.66, LDL 58, HDL 33, TSH 5.8, free T4 normal Labs (8/13): K 3.9, creatinine 0.8 Labs (9/13): TSH normal, K 3.9, creatinine 1.0 Labs (3/14): K 3.9, creatinine 1.0 Labs (5/14): HCT 38 Labs (12/14): HCT 39.7  PMH: 1. DM 2. HTN 3. Hypothyroidism 4. Hyperlipidemia 5. Atrial fibrillation: Paroxysmal.  Admitted 6/13 with afib/RVR, back to NSR on diltiazem.  Echo (7/13): EF 55-60%, mild MR, normal RV.  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  Noted during ETT in 7/13.  Cardiac MRI (7/13): EF 74%, normal RV size and systolic function with no evidence for ARVC, mild MR, no delayed enhancement.  7. ETT-myoview (7/13): 3' exercise, RVOT VT with rate 160s (LBBB/inferior axis) during  exercise, EF 78%, no ischemia or infarction.    SH: Single, lives in [place] with daughter.  Unemployed.  Prior smoker, none now.   FH: Sister with CAD diagnosed in her 59s.   Current Outpatient Prescriptions  Medication Sig Dispense Refill  . ALPRAZolam (XANAX) 0.25 MG tablet Take 0.25 mg by mouth 3 (three) times daily as needed for anxiety.       . chlorthalidone (HYGROTON) 25 MG tablet TAKE ONE TABLET BY MOUTH ONCE DAILY  30 tablet  0  . diltiazem (CARDIZEM CD) 360 MG 24 hr capsule TAKE ONE CAPSULE BY MOUTH ONCE DAILY  30 capsule  0  . ferrous sulfate 325 (65 FE) MG tablet Take 325 mg by mouth 2 (two) times daily.      . hydrALAZINE (APRESOLINE) 100 MG tablet Take 100 mg by mouth 2 (two) times daily.      . IRON PO Take 1 capsule by mouth 2 (two) times daily.      Marland Kitchen KLOR-CON M20 20 MEQ tablet TAKE ONE TABLET BY MOUTH TWICE DAILY  60 tablet  0  . levothyroxine (SYNTHROID, LEVOTHROID) 50 MCG tablet Take 50 mcg by mouth daily.      Marland Kitchen lisinopril (PRINIVIL,ZESTRIL) 20 MG tablet TAKE ONE TABLET BY MOUTH TWICE DAILY  60 tablet  0  . metFORMIN (GLUCOPHAGE) 500 MG tablet Take 500 mg by mouth 2 (two) times daily with a meal.       . metoprolol (TOPROL-XL) 200 MG 24 hr tablet Take 200 mg by mouth daily.      Marland Kitchen  NOVOLOG MIX 70/30 FLEXPEN (70-30) 100 UNIT/ML Pen 20 Units 2 (two) times daily.      Marland Kitchen omega-3 acid ethyl esters (LOVAZA) 1 G capsule Take 1 g by mouth daily.      . pravastatin (PRAVACHOL) 20 MG tablet Take 1 tablet by mouth at bedtime.       Alveda Reasons 20 MG TABS tablet TAKE ONE TABLET BY MOUTH ONCE DAILY  30 tablet  6   No current facility-administered medications for this visit.    BP 138/70  Pulse 69  Ht 5\' 8"  (1.727 m)  Wt 79.924 kg (176 lb 3.2 oz)  BMI 26.80 kg/m2  LMP 11/10/2012 General: NAD Neck: No JVD, no thyromegaly or thyroid nodule.  Lungs: Clear to auscultation bilaterally with normal respiratory effort. CV: Nondisplaced PMI.  Heart regular S1/S2, no S3/S4, 2/6  early SEM.  No edema.  No carotid bruit.  Normal pedal pulses.  Abdomen: Soft, nontender, no hepatosplenomegaly, no distention.  Neurologic: Alert and oriented x 3.  Psych: Anxious. Extremities: No clubbing or cyanosis.   Assessment/Plan: 1. Atrial fibrillation: Paroxysmal atrial fibrillation. She is in NSR today and has only rare, mild palpitations. Continue Toprol XL and diltiazem. CHADSVASC = 3 (female, HTN, DM). She is now on Xarelto.  BMET and CBC will be checked today given Xarelto use.  2. HTN: BP controlled.  3. 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 VT was seen during ETT, rate was 160 bpm. There was LBBB/inferior axis morphology. Myoview did not show evidence for ischemia or infarction. RVOT VT can be suppressed with beta blockers and CCBs. She is on both. Cardiac MRI and ECG were not suggestive of ARVC.   Loralie Champagne 12/20/2013

## 2013-12-22 ENCOUNTER — Other Ambulatory Visit: Payer: Self-pay | Admitting: Cardiology

## 2013-12-23 ENCOUNTER — Other Ambulatory Visit: Payer: Self-pay | Admitting: Cardiology

## 2013-12-24 ENCOUNTER — Other Ambulatory Visit: Payer: Self-pay | Admitting: Cardiology

## 2014-01-06 ENCOUNTER — Other Ambulatory Visit: Payer: Self-pay

## 2014-01-06 DIAGNOSIS — Z1231 Encounter for screening mammogram for malignant neoplasm of breast: Secondary | ICD-10-CM

## 2014-01-12 ENCOUNTER — Other Ambulatory Visit: Payer: Self-pay | Admitting: Cardiology

## 2014-01-19 ENCOUNTER — Other Ambulatory Visit: Payer: Self-pay | Admitting: Cardiology

## 2014-02-17 ENCOUNTER — Ambulatory Visit
Admission: RE | Admit: 2014-02-17 | Discharge: 2014-02-17 | Disposition: A | Discharge status: Home / Self Care | Payer: BC Managed Care – PPO | Source: Ambulatory Visit

## 2014-02-17 ENCOUNTER — Encounter (INDEPENDENT_AMBULATORY_CARE_PROVIDER_SITE_OTHER): Payer: Self-pay

## 2014-02-17 DIAGNOSIS — Z1231 Encounter for screening mammogram for malignant neoplasm of breast: Secondary | ICD-10-CM

## 2014-06-17 ENCOUNTER — Other Ambulatory Visit: Payer: Self-pay | Admitting: Cardiology

## 2014-06-21 ENCOUNTER — Other Ambulatory Visit: Payer: Self-pay | Admitting: Cardiology

## 2014-06-23 ENCOUNTER — Other Ambulatory Visit: Payer: Self-pay | Admitting: *Deleted

## 2014-06-23 ENCOUNTER — Other Ambulatory Visit: Payer: Self-pay | Admitting: Cardiology

## 2014-06-23 MED ORDER — POTASSIUM CHLORIDE CRYS ER 20 MEQ PO TBCR: 20.0000 meq | EXTENDED_RELEASE_TABLET | Freq: Two times a day (BID) | ORAL | Status: DC

## 2014-06-23 MED ORDER — DILTIAZEM HCL ER COATED BEADS 360 MG PO CP24: 360.0000 mg | ORAL_CAPSULE | Freq: Every day | ORAL | Status: DC

## 2014-07-15 ENCOUNTER — Ambulatory Visit (INDEPENDENT_AMBULATORY_CARE_PROVIDER_SITE_OTHER): Payer: BC Managed Care – PPO | Admitting: Cardiology

## 2014-07-15 ENCOUNTER — Encounter: Payer: Self-pay | Admitting: Cardiology

## 2014-07-15 VITALS — BP 124/62 | HR 61 | Ht 68.0 in | Wt 171.0 lb

## 2014-07-15 DIAGNOSIS — I10 Essential (primary) hypertension: Secondary | ICD-10-CM

## 2014-07-15 DIAGNOSIS — E785 Hyperlipidemia, unspecified: Secondary | ICD-10-CM

## 2014-07-15 DIAGNOSIS — I4891 Unspecified atrial fibrillation: Secondary | ICD-10-CM

## 2014-07-15 DIAGNOSIS — I480 Paroxysmal atrial fibrillation: Secondary | ICD-10-CM

## 2014-07-15 DIAGNOSIS — I472 Ventricular tachycardia, unspecified: Secondary | ICD-10-CM

## 2014-07-15 LAB — BASIC METABOLIC PANEL
BUN: 16 mg/dL (ref 6–23)
CO2: 26 mEq/L (ref 19–32)
Calcium: 10.3 mg/dL (ref 8.4–10.5)
Chloride: 104 mEq/L (ref 96–112)
Creatinine, Ser: 0.9 mg/dL (ref 0.4–1.2)
GFR: 83.98 mL/min (ref >60.00)
Glucose, Bld: 80 mg/dL (ref 70–99)
Potassium: 3.7 mEq/L (ref 3.5–5.1)
Sodium: 139 mEq/L (ref 135–145)

## 2014-07-15 LAB — CBC WITH DIFFERENTIAL/PLATELET
Basophils Absolute: 0.1 10*3/uL (ref 0.0–0.1)
Basophils Relative: 0.9 % (ref 0.0–3.0)
Eosinophils Absolute: 0.2 10*3/uL (ref 0.0–0.7)
Eosinophils Relative: 3.5 % (ref 0.0–5.0)
HCT: 41.4 % (ref 36.0–46.0)
Hemoglobin: 13 g/dL (ref 12.0–15.0)
Lymphocytes Relative: 29.9 % (ref 12.0–46.0)
Lymphs Abs: 1.7 10*3/uL (ref 0.7–4.0)
MCHC: 31.5 g/dL (ref 30.0–36.0)
MCV: 84.4 fl (ref 78.0–100.0)
Monocytes Absolute: 0.5 10*3/uL (ref 0.1–1.0)
Monocytes Relative: 9 % (ref 3.0–12.0)
Neutro Abs: 3.2 10*3/uL (ref 1.4–7.7)
Neutrophils Relative %: 56.7 % (ref 43.0–77.0)
Platelets: 316 10*3/uL (ref 150.0–400.0)
RBC: 4.91 Mil/uL (ref 3.87–5.11)
RDW: 14.2 % (ref 11.5–15.5)
WBC: 5.6 10*3/uL (ref 4.0–10.5)

## 2014-07-15 LAB — LIPID PANEL
Cholesterol: 175 mg/dL (ref 0–200)
HDL: 28.9 mg/dL — ABNORMAL LOW (ref >39.00)
LDL Cholesterol: 109 mg/dL — ABNORMAL HIGH (ref 0–99)
NonHDL: 146.1
Total CHOL/HDL Ratio: 6
Triglycerides: 188 mg/dL — ABNORMAL HIGH (ref 0.0–149.0)
VLDL: 37.6 mg/dL (ref 0.0–40.0)

## 2014-07-15 NOTE — Patient Instructions (Signed)
Your physician recommends that you return for lab work today--BMET/CBCd/Lipid profile.  Your physician wants you to follow-up in: 6 months with Dr Aundra Dubin (July 2016).  You will receive a reminder letter in the mail two months in advance. If you don't receive a letter, please call our office to schedule the follow-up appointment.

## 2014-07-16 ENCOUNTER — Other Ambulatory Visit: Payer: Self-pay | Admitting: *Deleted

## 2014-07-16 MED ORDER — CHLORTHALIDONE 25 MG PO TABS
25.0000 mg | ORAL_TABLET | Freq: Every day | ORAL | Status: DC
Start: 2014-07-16 — End: 2015-01-17

## 2014-07-16 MED ORDER — LISINOPRIL 20 MG PO TABS: 20.0000 mg | ORAL_TABLET | Freq: Two times a day (BID) | ORAL | Status: DC

## 2014-07-16 NOTE — Progress Notes (Signed)
Patient ID: DENIM START, female   DOB: 12/08/1955, 59 y.o.   MRN: [id number] PCP: Dr. Alroy Dust  59 yo with paroxysmal atrial fibrillation who also had VT during an exercise treadmill test presents for cardiology followup.  She was admitted in 6/13 with atrial fibrillation/RVR that was symptomatic (tachypalpitations).  She had no prior documented atrial fibrillation.  She converted back to NSR after getting diltiazem.  She was started on apixaban and discharged home.  Echo showed normal EF.  Given ST depression and some chest tightness with afib/RVR, I had her do an ETT-myoview.  The myoview showed no evidence for ischemia or infarction, but during the treadmill test she developed self-limited VT with rate in the 160s.  I reviewed the strips with Dr. 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  Cardiac MRI was done showing no evidence for ARVC.   She has been symptomatically stable since last appointment.  Since last appointment, she has had on one brief run of tachypalpitations for about 45 minutes 4-5 months ago. There was no associated lightheadedness or syncope.  No exertional dyspnea or exertional chest pain.  BP is controlled. No BRBPR, tolerating Xarelto without problems.   ECG: NSR, 1st degree AV block, left axis deviation.  Labs (6/13): K 4.2, creatinine 0.66, LDL 58, HDL 33, TSH 5.8, free T4 normal Labs (8/13): K 3.9, creatinine 0.8 Labs (9/13): TSH normal, K 3.9, creatinine 1.0 Labs (3/14): K 3.9, creatinine 1.0 Labs (5/14): HCT 38 Labs (12/14): HCT 39.7 Labs (6/15): K 3.7, creatinine 0.8, HCT 35.2  PMH: 1. DM 2. HTN 3. Hypothyroidism 4. Hyperlipidemia 5. Atrial fibrillation: Paroxysmal.  Admitted 6/13 with afib/RVR, back to NSR on diltiazem.  Echo (7/13): EF 55-60%, mild MR, normal RV.  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  Noted during ETT in 7/13.  Cardiac MRI (7/13): EF 74%, normal RV size and systolic function with no evidence for  ARVC, mild MR, no delayed enhancement.  7. 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    SH: Single, lives in [place] with daughter.  Unemployed.  Prior smoker, none now.   FH: Sister with CAD diagnosed in her 74s.   Current Outpatient Prescriptions  Medication Sig Dispense Refill  . ALPRAZolam (XANAX) 0.25 MG tablet Take 0.25 mg by mouth 3 (three) times daily as needed for anxiety.     . chlorthalidone (HYGROTON) 25 MG tablet TAKE ONE TABLET BY MOUTH ONCE DAILY 30 tablet 5  . diltiazem (CARDIZEM CD) 360 MG 24 hr capsule Take 1 capsule (360 mg total) by mouth daily. 30 capsule 5  . ferrous sulfate 325 (65 FE) MG tablet Take 325 mg by mouth 2 (two) times daily.    . hydrALAZINE (APRESOLINE) 100 MG tablet Take 100 mg by mouth 2 (two) times daily.    . IRON PO Take 1 capsule by mouth 2 (two) times daily.    Marland Kitchen levothyroxine (SYNTHROID, LEVOTHROID) 50 MCG tablet Take 50 mcg by mouth daily.    Marland Kitchen lisinopril (PRINIVIL,ZESTRIL) 20 MG tablet TAKE ONE TABLET BY MOUTH TWICE DAILY 60 tablet 0  . metFORMIN (GLUCOPHAGE) 500 MG tablet Take 500 mg by mouth 2 (two) times daily with a meal.     . metoprolol (TOPROL-XL) 200 MG 24 hr tablet Take 200 mg by mouth daily.    Marland Kitchen NOVOLOG MIX 70/30 FLEXPEN (70-30) 100 UNIT/ML Pen 20 Units 2 (two) times daily.    Marland Kitchen  omega-3 acid ethyl esters (LOVAZA) 1 G capsule Take 1 g by mouth daily.    . potassium chloride SA (KLOR-CON M20) 20 MEQ tablet Take 1 tablet (20 mEq total) by mouth 2 (two) times daily. 60 tablet 5  . pravastatin (PRAVACHOL) 20 MG tablet Take 1 tablet by mouth at bedtime.     Alveda Reasons 20 MG TABS tablet TAKE ONE TABLET BY MOUTH ONCE DAILY 30 tablet 5   No current facility-administered medications for this visit.    BP 124/62 mmHg  Pulse 61  Ht 5\' 8"  (1.727 m)  Wt 171 lb (77.565 kg)  BMI 26.01 kg/m2  LMP 11/10/2012 General: NAD Neck: No JVD, no thyromegaly or  thyroid nodule.  Lungs: Clear to auscultation bilaterally with normal respiratory effort. CV: Nondisplaced PMI.  Heart regular S1/S2, no S3/S4, 2/6 early SEM.  No edema.  No carotid bruit.  Normal pedal pulses.  Abdomen: Soft, nontender, no hepatosplenomegaly, no distention.  Neurologic: Alert and oriented x 3.  Psych: Anxious. Extremities: No clubbing or cyanosis.   Assessment/Plan: 1. Atrial fibrillation: Paroxysmal atrial fibrillation. She is in NSR today and has only rare, mild palpitations. Continue Toprol XL and diltiazem. CHADSVASC = 3 (female, HTN, DM). She is now on Xarelto.  BMET and CBC will be checked today given Xarelto use.  2. HTN: BP controlled.  3. 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 VT was seen during ETT, rate was 160 bpm. There was LBBB/inferior axis morphology. Myoview did not show evidence for ischemia or infarction. RVOT VT can be suppressed with beta blockers and CCBs. She is on both. Cardiac MRI and ECG were not suggestive of ARVC.  4. Hyperlipidemia: Check lipids.   Loralie Champagne 07/16/2014

## 2014-11-04 ENCOUNTER — Ambulatory Visit
Admission: RE | Admit: 2014-11-04 | Discharge: 2014-11-04 | Disposition: A | Discharge status: Home / Self Care | Payer: BC Managed Care – PPO | Source: Ambulatory Visit | Attending: Family Medicine | Admitting: Family Medicine

## 2014-11-04 ENCOUNTER — Other Ambulatory Visit: Payer: Self-pay | Admitting: Family Medicine

## 2014-11-04 DIAGNOSIS — J209 Acute bronchitis, unspecified: Secondary | ICD-10-CM

## 2014-12-19 ENCOUNTER — Other Ambulatory Visit: Payer: Self-pay | Admitting: Cardiology

## 2014-12-28 ENCOUNTER — Other Ambulatory Visit: Payer: Self-pay | Admitting: Cardiology

## 2015-01-07 ENCOUNTER — Other Ambulatory Visit: Payer: Self-pay

## 2015-01-07 DIAGNOSIS — Z1231 Encounter for screening mammogram for malignant neoplasm of breast: Secondary | ICD-10-CM

## 2015-01-17 ENCOUNTER — Other Ambulatory Visit: Payer: Self-pay | Admitting: Cardiology

## 2015-01-17 MED ORDER — CHLORTHALIDONE 25 MG PO TABS
25.0000 mg | ORAL_TABLET | Freq: Every day | ORAL | Status: DC
Start: 2015-01-17 — End: 2015-02-04

## 2015-02-02 NOTE — Progress Notes (Signed)
Cardiology Office Note   Date:  02/03/2015   ID:  Alison, Porter [birth year], MRN [id number]  PCP:  Alison Coffin, MD  Cardiologist:  Dr. Loralie Porter   Electrophysiologist:  n/a  Chief Complaint  Patient presents with  . Atrial Fibrillation     History of Present Illness: Alison Porter is a 59 y.o. female with a hx of paroxysmal atrial fibrillation and VT during an exercise treadmill test . She was admitted in 12/2011 with AF with RVR that was symptomatic (tachypalpitations). She had no prior documented atrial fibrillation. She converted back to NSR after getting diltiazem. She was started on apixaban and discharged home. Echo showed normal EF. Given ST depression and some chest tightness with afib/RVR, she underwent an ETT-myoview. The myoview showed no evidence for ischemia or infarction, but during the treadmill test she developed self-limited VT with rate in the 160s. This was reviewed with Dr. 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 Cardiac MRI was done showing no evidence for ARVC.   Last seen by Dr. Aundra Porter 07/15/14. Toprol, diltiazem and Xarelto were continued.  She returns for FU.  She is doing well.  The patient denies chest pain, shortness of breath, syncope, orthopnea, PND or significant pedal edema..  She has had a cough with clear sputum production for the past several mos.     Studies/Reports Reviewed Today:  Cardiac MRI 03/2012 Impression: 1) No evidence of RV dysplasia Normal RV size and function 2) Normal LV EF 74% no hyperenhancemet 3) Mild LAE 4) Mild MR  Event Monitor 01/2012 No significant arrhythmias  Myoview 01/2012 Normal nuclear images and EF. However patient had significant arrhythmia with T wave changes in recovery. ? RVOT VT Apparently ECG;s showed to Dr Alison Porter and patient allowed to go home. Discussed with Dr Alison Porter who will F/U with patient LV Ejection Fraction: 78%. LV Wall Motion: NL LV  Function; NL Wall Motion  Echo 01/2012 -EF 55% to 60%. Wall motion was normal;   - Mitral valve: Mild regurgitation. - Left atrium: The atrium was mildly dilated. - Atrial septum: No defect or patent foramen ovale wasidentified. - Pulmonary arteries: PA peak pressure: 75mm Hg (S).   Past Medical History  Diagnosis Date  . Unspecified essential hypertension   . Type II or unspecified type diabetes mellitus without mention of complication, not stated as uncontrolled   . Other and unspecified hyperlipidemia   . Unspecified hypothyroidism   . Anxiety   . Heart murmur   . Hypokalemia   . Atrial fibrillation   . Anemia   . Anxiety   1. DM 2. HTN 3. Hypothyroidism 4. Hyperlipidemia 5. Atrial fibrillation: Paroxysmal. Admitted 6/13 with afib/RVR, back to NSR on diltiazem. Echo (7/13): EF 55-60%, mild MR, normal RV.  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 Noted during ETT in 7/13. Cardiac MRI (7/13): EF 74%, normal RV size and systolic function with no evidence for ARVC, mild MR, no delayed enhancement.  7. 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     Past Surgical History  Procedure Laterality Date  . Eye surgery Left      Current Outpatient Prescriptions  Medication Sig Dispense Refill  . ALPRAZolam (XANAX) 0.25 MG tablet Take 0.25 mg by mouth 3 (three) times daily as needed for anxiety.     . chlorthalidone (HYGROTON) 25 MG tablet Take 1 tablet (25 mg total) by mouth  daily. 30 tablet 5  . diltiazem (CARDIZEM CD) 360 MG 24 hr capsule TAKE ONE CAPSULE BY MOUTH ONCE DAILY 30 capsule 6  . ferrous sulfate 325 (65 FE) MG tablet Take 325 mg by mouth 2 (two) times daily.    . hydrALAZINE (APRESOLINE) 100 MG tablet Take 100 mg by mouth 2 (two) times daily.    . IRON PO Take 1 capsule by mouth 2 (two) times daily.    Marland Kitchen KLOR-CON M20 20 MEQ tablet TAKE ONE TABLET BY MOUTH TWICE  DAILY 60 tablet 6  . levothyroxine (SYNTHROID, LEVOTHROID) 50 MCG tablet Take 50 mcg by mouth daily.    Marland Kitchen lisinopril (PRINIVIL,ZESTRIL) 20 MG tablet TAKE ONE TABLET BY MOUTH TWICE DAILY 60 tablet 6  . metFORMIN (GLUCOPHAGE) 500 MG tablet Take 500 mg by mouth 2 (two) times daily with a meal.     . metoprolol (TOPROL-XL) 200 MG 24 hr tablet Take 200 mg by mouth daily.    Marland Kitchen NOVOLOG MIX 70/30 FLEXPEN (70-30) 100 UNIT/ML Pen 20 Units 2 (two) times daily.    Marland Kitchen omega-3 acid ethyl esters (LOVAZA) 1 G capsule Take 1 g by mouth daily.    Alison Porter VERIO test strip 1 each by Other route as needed (testing blood sugar).     . pravastatin (PRAVACHOL) 40 MG tablet Take 40 mg by mouth daily.     Alison Porter 20 MG TABS tablet TAKE ONE TABLET BY MOUTH ONCE DAILY 30 tablet 6  . pantoprazole (PROTONIX) 40 MG tablet Take 1 tablet by mouth once daily for 1 month.  Then, take as needed. 30 tablet 1   No current facility-administered medications for this visit.    Allergies:   Codeine phosphate    Social History:  The patient  reports that she quit smoking about 31 years ago. Her smoking use included Cigarettes. She has never used smokeless tobacco. She reports that she does not drink alcohol or use illicit drugs.   Family History:  The patient's family history includes Cancer in her sister; Diabetes in her brother and sister; Hypertension in her mother; Stroke in her mother.    ROS:   Please see the history of present illness.   Review of Systems  Respiratory: Positive for cough.   All other systems reviewed and are negative.     PHYSICAL EXAM: VS:  BP 138/50 mmHg  Pulse 73  Ht 5\' 8"  (1.727 m)  Wt 172 lb (78.019 kg)  BMI 26.16 kg/m2  LMP 11/10/2012    Wt Readings from Last 3 Encounters:  02/03/15 172 lb (78.019 kg)  07/15/14 171 lb (77.565 kg)  12/18/13 176 lb 3.2 oz (79.924 kg)     GEN: Well nourished, well developed, in no acute distress HEENT: normal Neck: no JVD, no carotid bruits, no  masses Cardiac:  Normal H6/D1, RRR; 2/6 systolic murmur RUSB,  no rubs or gallops, no edema   Respiratory:  clear to auscultation bilaterally, no wheezing, rhonchi or rales. GI: soft, nontender, nondistended, + BS MS: no deformity or atrophy Skin: warm and dry  Neuro:  CNs II-XII intact, Strength and sensation are intact Psych: Normal affect   EKG:  EKG is ordered today.  It demonstrates:   NSR, HR 73, LAD, NSSTTW changes, no change from prior tracing   Recent Labs: 07/15/2014: BUN 16; Creatinine, Ser 0.9; Hemoglobin 13.0; Platelets 316.0; Potassium 3.7; Sodium 139    Lipid Panel    Component Value Date/Time   CHOL 175 07/15/2014  1458   TRIG 188.0* 07/15/2014 1458   HDL 28.90* 07/15/2014 1458   CHOLHDL 6 07/15/2014 1458   VLDL 37.6 07/15/2014 1458   LDLCALC 109* 07/15/2014 1458      ASSESSMENT AND PLAN:  1.  Paroxysmal atrial fibrillation:  She is in NSR today and has only rare, mild palpitations. Continue Toprol XL and diltiazem. CHADSVASC = 3 (female, HTN, DM). She is on Xarelto. BMET and CBC will be checked today given Xarelto use.  2.  HTN: BP controlled.  3.  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 VT was seen during ETT, rate was 160 bpm. There was LBBB/inferior axis morphology. Myoview did not show evidence for ischemia or infarction. RVOT VT can be suppressed with beta blockers and CCBs. She is on both. Cardiac MRI and ECG were not suggestive of ARVC.  4.  Hyperlipidemia: LDL at goal by labs in 07/2014.   5.  GERD:  Patient does have a history of GERD. She does have a cough that has been ongoing for several months. She's taken 2 rounds of antibiotics. I considered changing her ACE inhibitor to an ARB. However, her cough is somewhat productive. Question if her cough may be related to acid reflux. I offered her a trial of PPI. She would like to try this. Start Protonix 40 mg daily for one month. She can take it as needed after that. If her cough does not improve, follow up  with primary care.  If cough is better with PPI, she may need to see GI.    Medication Changes: Current medicines are reviewed at length with the patient today.  Concerns regarding medicines are as outlined above.  The following changes have been made:   Discontinued Medications   PRAVASTATIN (PRAVACHOL) 20 MG TABLET    Take 1 tablet by mouth at bedtime.    Modified Medications   No medications on file   New Prescriptions   PANTOPRAZOLE (PROTONIX) 40 MG TABLET    Take 1 tablet by mouth once daily for 1 month.  Then, take as needed.    Labs/ tests ordered today include:   Orders Placed This Encounter  Procedures  . Basic metabolic panel  . CBC with Differential/Platelet  . EKG 12-Lead     Disposition:   FU with Dr. Loralie Porter 6 mos.    Signed, Versie Starks, MHS 02/03/2015 10:35 AM    [facility] [place], [place], [place]  [address] Phone: [phone]; Fax: [phone]

## 2015-02-03 ENCOUNTER — Telehealth: Payer: Self-pay | Admitting: *Deleted

## 2015-02-03 ENCOUNTER — Ambulatory Visit (INDEPENDENT_AMBULATORY_CARE_PROVIDER_SITE_OTHER): Payer: BC Managed Care – PPO | Admitting: Physician Assistant

## 2015-02-03 ENCOUNTER — Telehealth: Payer: Self-pay | Admitting: Physician Assistant

## 2015-02-03 ENCOUNTER — Encounter: Payer: Self-pay | Admitting: Physician Assistant

## 2015-02-03 VITALS — BP 138/50 | HR 73 | Ht 68.0 in | Wt 172.0 lb

## 2015-02-03 DIAGNOSIS — K219 Gastro-esophageal reflux disease without esophagitis: Secondary | ICD-10-CM

## 2015-02-03 DIAGNOSIS — I480 Paroxysmal atrial fibrillation: Secondary | ICD-10-CM | POA: Diagnosis not present

## 2015-02-03 DIAGNOSIS — I472 Ventricular tachycardia, unspecified: Secondary | ICD-10-CM

## 2015-02-03 DIAGNOSIS — I10 Essential (primary) hypertension: Secondary | ICD-10-CM

## 2015-02-03 DIAGNOSIS — E785 Hyperlipidemia, unspecified: Secondary | ICD-10-CM

## 2015-02-03 DIAGNOSIS — Z7901 Long term (current) use of anticoagulants: Secondary | ICD-10-CM

## 2015-02-03 LAB — CBC WITH DIFFERENTIAL/PLATELET
Basophils Absolute: 0 10*3/uL (ref 0.0–0.1)
Basophils Relative: 0.6 % (ref 0.0–3.0)
Eosinophils Absolute: 0.2 10*3/uL (ref 0.0–0.7)
Eosinophils Relative: 4.1 % (ref 0.0–5.0)
HCT: 38.4 % (ref 36.0–46.0)
Hemoglobin: 12.6 g/dL (ref 12.0–15.0)
Lymphocytes Relative: 20.7 % (ref 12.0–46.0)
Lymphs Abs: 1.1 10*3/uL (ref 0.7–4.0)
MCHC: 32.7 g/dL (ref 30.0–36.0)
MCV: 81 fl (ref 78.0–100.0)
Monocytes Absolute: 0.5 10*3/uL (ref 0.1–1.0)
Monocytes Relative: 10.5 % (ref 3.0–12.0)
Neutro Abs: 3.3 10*3/uL (ref 1.4–7.7)
Neutrophils Relative %: 64.1 % (ref 43.0–77.0)
Platelets: 297 10*3/uL (ref 150.0–400.0)
RBC: 4.74 Mil/uL (ref 3.87–5.11)
RDW: 15.8 % — ABNORMAL HIGH (ref 11.5–15.5)
WBC: 5.1 10*3/uL (ref 4.0–10.5)

## 2015-02-03 LAB — BASIC METABOLIC PANEL
BUN: 13 mg/dL (ref 6–23)
CO2: 27 mEq/L (ref 19–32)
Calcium: 10 mg/dL (ref 8.4–10.5)
Chloride: 104 mEq/L (ref 96–112)
Creatinine, Ser: 0.85 mg/dL (ref 0.40–1.20)
GFR: 88.39 mL/min (ref >60.00)
Glucose, Bld: 199 mg/dL — ABNORMAL HIGH (ref 70–99)
Potassium: 3.7 mEq/L (ref 3.5–5.1)
Sodium: 138 mEq/L (ref 135–145)

## 2015-02-03 MED ORDER — PANTOPRAZOLE SODIUM 40 MG PO TBEC: DELAYED_RELEASE_TABLET | ORAL | Status: DC

## 2015-02-03 NOTE — Telephone Encounter (Signed)
Pt was notified that her lab results were ok and pt verbalized understanding.. jkw 02-03-15

## 2015-02-03 NOTE — Telephone Encounter (Signed)
New message      Pt was seen today.  She was sitting down.  When she stood up she felt "woozy".  She took her bp and it was 97/50.  Please advise

## 2015-02-03 NOTE — Patient Instructions (Signed)
Medication Instructions:  Take Protonix 40 mg daily 30 minutes before breakfast daily for one month. Then use once daily as needed for indigestion.  Labwork: Today: BMET, CBC  Testing/Procedures: None  Follow-Up: Dr. Aundra Dubin in 6 months.  Any Other Special Instructions Will Be Listed Below (If Applicable).  If cough no better on Protonix, follow-up with primary care for further evaluation of cough.

## 2015-02-04 MED ORDER — CHLORTHALIDONE 25 MG PO TABS: 12.5000 mg | ORAL_TABLET | Freq: Every day | ORAL | Status: DC

## 2015-02-04 MED ORDER — POTASSIUM CHLORIDE CRYS ER 20 MEQ PO TBCR: 20.0000 meq | EXTENDED_RELEASE_TABLET | Freq: Every day | ORAL | Status: DC

## 2015-02-04 NOTE — Telephone Encounter (Signed)
she had called earlier re: her bp dropping and she was feeling whoozy.  She stated that the 1st reading was 97/50 when she got out of the car, then few mins later it was 119/50.  Please advise!  Thanks!  Anderson Malta

## 2015-02-04 NOTE — Telephone Encounter (Signed)
Decrease Chlorthalidone to 12.5 mg QD and K+ to 20 mEq QD. Richardson Dopp, PA-C   02/04/2015 1:38 PM

## 2015-02-04 NOTE — Telephone Encounter (Signed)
pt was notified of the medication changes re: her bp dropping 02-03-15.  Pt was advised that if she had any trouble with real high bp's to call the office back.  Pt verbalized understanding.Alison Porter 02-04-15

## 2015-02-21 ENCOUNTER — Ambulatory Visit
Admission: RE | Admit: 2015-02-21 | Discharge: 2015-02-21 | Disposition: A | Discharge status: Home / Self Care | Payer: BC Managed Care – PPO | Source: Ambulatory Visit

## 2015-02-21 DIAGNOSIS — Z1231 Encounter for screening mammogram for malignant neoplasm of breast: Secondary | ICD-10-CM

## 2015-02-22 ENCOUNTER — Telehealth: Payer: Self-pay | Admitting: Cardiology

## 2015-02-22 DIAGNOSIS — I10 Essential (primary) hypertension: Secondary | ICD-10-CM

## 2015-02-22 MED ORDER — LOSARTAN POTASSIUM 50 MG PO TABS: 50.0000 mg | ORAL_TABLET | Freq: Two times a day (BID) | ORAL | Status: DC

## 2015-02-22 NOTE — Telephone Encounter (Signed)
Pt advised, verbalized understanding, BMET scheduled for 03/09/15.

## 2015-02-22 NOTE — Telephone Encounter (Signed)
New Message  Pt called c/o of coughing (pt stated since March) thinking it has to do with Losinopril. Please call back and discuss possible alternatives.

## 2015-02-22 NOTE — Telephone Encounter (Signed)
Pt states she has had a cough for several months, pt states her PCP had prescribed an antibiotic and it did not seem to make a difference in her cough. Pt states Scott talked with her about changing lisinopril to different medication because of her cough, pt would like to change lisinopril to see if changing medication helps her cough. Pt advised I will forward to Dr Aundra Dubin for review.

## 2015-02-22 NOTE — Telephone Encounter (Signed)
Stop lisinopril, start losartan 50 mg bid with BMET in 2 wks

## 2015-02-25 ENCOUNTER — Telehealth: Payer: Self-pay

## 2015-02-25 NOTE — Telephone Encounter (Signed)
I spoke with patient and answered questions about medications.

## 2015-02-25 NOTE — Telephone Encounter (Signed)
Pt has questions about which blood pressure med to take please call [phone]

## 2015-03-09 ENCOUNTER — Other Ambulatory Visit: Payer: BC Managed Care – PPO

## 2015-03-11 ENCOUNTER — Other Ambulatory Visit: Payer: BC Managed Care – PPO

## 2015-03-22 ENCOUNTER — Encounter: Payer: Self-pay | Admitting: Cardiology

## 2015-07-13 ENCOUNTER — Other Ambulatory Visit: Payer: Self-pay | Admitting: Cardiology

## 2015-07-25 ENCOUNTER — Other Ambulatory Visit: Payer: Self-pay | Admitting: Cardiology

## 2015-08-04 NOTE — Progress Notes (Signed)
Cardiology Office Note:    Date:  08/05/2015   ID:  Alison Porter, DOB [birth year], MRN [id number]  PCP:  Donnie Coffin, MD  Cardiologist:  Dr. Loralie Champagne   Electrophysiologist:  n/a  Chief Complaint  Patient presents with  . Atrial Fibrillation    follow up    History of Present Illness:    Alison Porter is a 60 y.o. female with a hx of paroxysmal atrial fibrillation and VT during an exercise treadmill test . She was admitted in 12/2011 with AF with RVR that was symptomatic (tachypalpitations). She had no prior documented atrial fibrillation. She converted back to NSR after getting diltiazem. She was started on apixaban and discharged home. Echo showed normal EF. Given ST depression and some chest tightness with afib/RVR, she underwent an ETT-myoview. The myoview showed no evidence for ischemia or infarction, but during the treadmill test she developed self-limited VT with rate in the 160s. This was reviewed with Dr. 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 Cardiac MRI was done showing no evidence for ARVC.   Last seen by me 7/16. Returns for FU.  She remains on Toprol, diltiazem and Xarelto. She is doing well. She has occasional palpitations.  There has been no change.  The patient denies chest pain, shortness of breath, syncope, orthopnea, PND or significant pedal edema.    Past Medical History  Diagnosis Date  . Unspecified essential hypertension   . Type II or unspecified type diabetes mellitus without mention of complication, not stated as uncontrolled   . Other and unspecified hyperlipidemia   . Unspecified hypothyroidism   . Anxiety   . Heart murmur   . Hypokalemia   . Atrial fibrillation ([place])   . Anemia   . Anxiety   1. DM 2. HTN 3. Hypothyroidism 4. Hyperlipidemia 5. Atrial fibrillation: Paroxysmal. Admitted 6/13 with afib/RVR, back to NSR on diltiazem. Echo (7/13): EF 55-60%, mild MR, normal RV.  6. Ventricular  tachycardia: Suspect RVOT tachycardia. Noted during ETT in 7/13. Cardiac MRI (7/13): EF 74%, normal RV size and systolic function with no evidence for ARVC, mild MR, no delayed enhancement.  7. 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    Past Surgical History  Procedure Laterality Date  . Eye surgery Left     Current Medications: Outpatient Prescriptions Prior to Visit  Medication Sig Dispense Refill  . ALPRAZolam (XANAX) 0.25 MG tablet Take 0.25 mg by mouth 3 (three) times daily as needed for anxiety.     . chlorthalidone (HYGROTON) 25 MG tablet Take 0.5 tablets (12.5 mg total) by mouth daily.    Marland Kitchen diltiazem (CARDIZEM CD) 360 MG 24 hr capsule TAKE ONE CAPSULE BY MOUTH ONCE DAILY 30 capsule 5  . ferrous sulfate 325 (65 FE) MG tablet Take 325 mg by mouth 2 (two) times daily.    . hydrALAZINE (APRESOLINE) 100 MG tablet Take 100 mg by mouth 2 (two) times daily.    . IRON PO Take 1 capsule by mouth 2 (two) times daily.    Marland Kitchen levothyroxine (SYNTHROID, LEVOTHROID) 50 MCG tablet Take 50 mcg by mouth daily.    Marland Kitchen losartan (COZAAR) 50 MG tablet Take 1 tablet (50 mg total) by mouth 2 (two) times daily. 60 tablet 6  . metFORMIN (GLUCOPHAGE) 500 MG tablet Take 500 mg by mouth 2 (two) times daily with a meal.     . metoprolol (  TOPROL-XL) 200 MG 24 hr tablet Take 200 mg by mouth daily.    Marland Kitchen NOVOLOG MIX 70/30 FLEXPEN (70-30) 100 UNIT/ML Pen 20 Units 2 (two) times daily.    Marland Kitchen omega-3 acid ethyl esters (LOVAZA) 1 G capsule Take 1 g by mouth daily.    Glory Rosebush VERIO test strip 1 each by Other route as needed (testing blood sugar).     . potassium chloride SA (KLOR-CON M20) 20 MEQ tablet Take 1 tablet (20 mEq total) by mouth daily.    . pravastatin (PRAVACHOL) 40 MG tablet Take 40 mg by mouth daily.     Alveda Reasons 20 MG TABS tablet TAKE ONE TABLET BY MOUTH ONCE DAILY 30 tablet 0  . pantoprazole (PROTONIX) 40 MG tablet  Take 1 tablet by mouth once daily for 1 month.  Then, take as needed. (Patient not taking: Reported on 08/05/2015) 30 tablet 1   No facility-administered medications prior to visit.     Allergies:   Codeine phosphate   Social History   Social History  . Marital Status: Single    Spouse Name: N/A  . Number of Children: 4  . Years of Education: N/A   Occupational History  . retired    Social History Main Topics  . Smoking status: Former Smoker    Types: Cigarettes    Quit date: 07/10/1983  . Smokeless tobacco: Never Used  . Alcohol Use: No  . Drug Use: No  . Sexual Activity: Not Asked   Other Topics Concern  . None   Social History Narrative     Family History:  The patient's family history includes Cancer in her sister; Diabetes in her brother and sister; Hypertension in her mother; Stroke in her mother.   ROS:   Please see the history of present illness.    ROS All other systems reviewed and are negative.   Physical Exam:    VS:  BP 140/58 mmHg  Pulse 74  Ht 5\' 8"  (1.727 m)  Wt 176 lb 12.8 oz (80.196 kg)  BMI 26.89 kg/m2  LMP 11/10/2012   GEN: Well nourished, well developed, in no acute distress HEENT: normal Neck: no JVD, no masses Cardiac: Normal S1/S2, RRR; [id number] early systolic murmur,  no edema;  no carotid bruits,   Respiratory:  clear to auscultation bilaterally; no wheezing, rhonchi or rales GI: soft, nontender, nondistended, + BS MS: no deformity or atrophy Skin: warm and dry, no rash Neuro:  no focal deficits  Psych: Alert and oriented x 3, normal affect  Wt Readings from Last 3 Encounters:  08/05/15 176 lb 12.8 oz (80.196 kg)  02/03/15 172 lb (78.019 kg)  07/15/14 171 lb (77.565 kg)      Studies/Labs Reviewed:    EKG:  EKG is  ordered today.  The ekg ordered today demonstrates NSR, HR 74, left axis deviation, T-wave inversions V3-V5, QTc 472 ms, similar to prior tracings  Recent Labs: 02/03/2015: BUN 13; Creatinine, Ser 0.85; Hemoglobin  12.6; Platelets 297.0; Potassium 3.7; Sodium 138   Recent Lipid Panel    Component Value Date/Time   CHOL 175 07/15/2014 1458   TRIG 188.0* 07/15/2014 1458   HDL 28.90* 07/15/2014 1458   CHOLHDL 6 07/15/2014 1458   VLDL 37.6 07/15/2014 1458   LDLCALC 109* 07/15/2014 1458    Additional studies/ records that were reviewed today include:   Cardiac MRI 03/2012 Impression: 1) No evidence of RV dysplasia Normal RV size and function 2) Normal LV EF 74% no  hyperenhancemet 3) Mild LAE 4) Mild MR  Event Monitor 01/2012 No significant arrhythmias  Myoview 01/2012 Normal nuclear images and EF. However patient had significant arrhythmia with T wave changes in recovery. ? RVOT VT Apparently ECG;s showed to Dr Lovena Le and patient allowed to go home. Discussed with Dr Aundra Dubin who will F/U with patient LV Ejection Fraction: 78%. LV Wall Motion: NL LV Function; NL Wall Motion  Echo 01/2012 EF 55-60%, normal wall motion, mild MR, mild LAE, PASP 37 mmHg   ASSESSMENT:    1. PAF (paroxysmal atrial fibrillation) ([place])   2. Essential hypertension   3. VT (ventricular tachycardia) ([place])   4. Hyperlipidemia     PLAN:    In order of problems listed above:  1. Paroxysmal atrial fibrillation: She is in NSR today and has only rare, mild palpitations. Continue Toprol XL and diltiazem. CHADSVASC = 3 (female, HTN, DM). She is on Xarelto. BMET and CBC will be checked today given Xarelto use.    2. HTN: BP controlled. BP at home is optimal.  3. 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 VT was seen during ETT, rate was 160 bpm. There was LBBB/inferior axis morphology. Myoview did not show evidence for ischemia or infarction. RVOT VT can be suppressed with beta blockers and CCBs. She is on both. Cardiac MRI and ECG were not suggestive of ARVC.    4. Hyperlipidemia: LDL at goal by labs in 07/2014. Arrange FU Lipids and LFTs before next visit.     Medication Adjustments/Labs and  Tests Ordered: Current medicines are reviewed at length with the patient today.  Concerns regarding medicines are outlined above.  Medication changes, Labs and Tests ordered today are outlined in the Patient Instructions noted below. Patient Instructions  Medication Instructions:  No changes. See the medication list attached.  Labwork: Today - BMET, CBC  Testing/Procedures: None   Follow-Up: Dr. Loralie Champagne (or Richardson Dopp, PA-C) in 6 months.  Any Other Special Instructions Will Be Listed Below (If Applicable).    Signed, Richardson Dopp, PA-C  08/05/2015 12:42 PM    [facility] [place], [place], [place]  [address] Phone: [phone]; Fax: [phone]

## 2015-08-05 ENCOUNTER — Encounter: Payer: Self-pay | Admitting: Physician Assistant

## 2015-08-05 ENCOUNTER — Ambulatory Visit (INDEPENDENT_AMBULATORY_CARE_PROVIDER_SITE_OTHER): Payer: BC Managed Care – PPO | Admitting: Physician Assistant

## 2015-08-05 VITALS — BP 140/58 | HR 74 | Ht 68.0 in | Wt 176.8 lb

## 2015-08-05 DIAGNOSIS — I10 Essential (primary) hypertension: Secondary | ICD-10-CM

## 2015-08-05 DIAGNOSIS — I480 Paroxysmal atrial fibrillation: Secondary | ICD-10-CM

## 2015-08-05 DIAGNOSIS — E785 Hyperlipidemia, unspecified: Secondary | ICD-10-CM | POA: Diagnosis not present

## 2015-08-05 DIAGNOSIS — I472 Ventricular tachycardia, unspecified: Secondary | ICD-10-CM

## 2015-08-05 LAB — CBC WITH DIFFERENTIAL/PLATELET
Basophils Absolute: 0 10*3/uL (ref 0.0–0.1)
Basophils Relative: 0 % (ref 0–1)
Eosinophils Absolute: 0.1 10*3/uL (ref 0.0–0.7)
Eosinophils Relative: 2 % (ref 0–5)
HCT: 40.1 % (ref 36.0–46.0)
Hemoglobin: 12.8 g/dL (ref 12.0–15.0)
Lymphocytes Relative: 23 % (ref 12–46)
Lymphs Abs: 1 10*3/uL (ref 0.7–4.0)
MCH: 26.9 pg (ref 26.0–34.0)
MCHC: 31.9 g/dL (ref 30.0–36.0)
MCV: 84.4 fL (ref 78.0–100.0)
MPV: 10.6 fL (ref 8.6–12.4)
Monocytes Absolute: 0.5 10*3/uL (ref 0.1–1.0)
Monocytes Relative: 11 % (ref 3–12)
Neutro Abs: 2.7 10*3/uL (ref 1.7–7.7)
Neutrophils Relative %: 64 % (ref 43–77)
Platelets: 275 10*3/uL (ref 150–400)
RBC: 4.75 MIL/uL (ref 3.87–5.11)
RDW: 14.4 % (ref 11.5–15.5)
WBC: 4.2 10*3/uL (ref 4.0–10.5)

## 2015-08-05 LAB — BASIC METABOLIC PANEL
BUN: 17 mg/dL (ref 7–25)
CO2: 25 mmol/L (ref 20–31)
Calcium: 9.6 mg/dL (ref 8.6–10.4)
Chloride: 103 mmol/L (ref 98–110)
Creat: 1.01 mg/dL (ref 0.50–1.05)
Glucose, Bld: 168 mg/dL — ABNORMAL HIGH (ref 65–99)
Potassium: 3.7 mmol/L (ref 3.5–5.3)
Sodium: 139 mmol/L (ref 135–146)

## 2015-08-05 NOTE — Patient Instructions (Signed)
Medication Instructions:  No changes. See the medication list attached.  Labwork: Today - BMET, CBC  Testing/Procedures: None   Follow-Up: Dr. Loralie Champagne (or Richardson Dopp, PA-C) in 6 months.  Any Other Special Instructions Will Be Listed Below (If Applicable).

## 2015-08-08 ENCOUNTER — Telehealth: Payer: Self-pay | Admitting: *Deleted

## 2015-08-08 NOTE — Telephone Encounter (Signed)
Pt has been notified of lab results by phone with verbal understanding. 

## 2015-08-14 ENCOUNTER — Other Ambulatory Visit: Payer: Self-pay | Admitting: Cardiology

## 2015-09-23 ENCOUNTER — Other Ambulatory Visit: Payer: Self-pay

## 2015-09-23 DIAGNOSIS — I10 Essential (primary) hypertension: Secondary | ICD-10-CM

## 2015-09-23 MED ORDER — LOSARTAN POTASSIUM 50 MG PO TABS: 50.0000 mg | ORAL_TABLET | Freq: Two times a day (BID) | ORAL | Status: DC

## 2016-01-09 ENCOUNTER — Other Ambulatory Visit: Payer: Self-pay | Admitting: Cardiology

## 2016-01-09 NOTE — Telephone Encounter (Signed)
Rx for Potassium has been sent to the Pharmacy for refills

## 2016-01-18 ENCOUNTER — Other Ambulatory Visit: Payer: Self-pay | Admitting: Cardiology

## 2016-02-21 ENCOUNTER — Other Ambulatory Visit: Payer: Self-pay | Admitting: Cardiology

## 2016-07-05 ENCOUNTER — Other Ambulatory Visit: Payer: Self-pay | Admitting: Physician Assistant

## 2017-08-12 ENCOUNTER — Ambulatory Visit: Payer: BC Managed Care – PPO | Admitting: Physician Assistant

## 2017-09-09 ENCOUNTER — Ambulatory Visit: Payer: BC Managed Care – PPO | Admitting: Physician Assistant

## 2017-10-06 NOTE — Progress Notes (Signed)
Cardiology Office Note:    Date:  10/07/2017   ID:  Alison Porter, DOB 1956/06/07, MRN [id number]  PCP:  Aurea Graff.[name], MD  Cardiologist:  No primary care provider on file.   Referring MD: Alroy Dust, L.[name], MD   Chief Complaint  Patient presents with  . Follow-up    Atrial fibrillation    History of Present Illness:    Alison Porter is a 62 y.o. female with paroxysmal atrial fibrillation, VT during an exercise test.  She was admitted in 12/2011 with AF with RVR that was symptomatic (tachypalpitations). She had no prior documented atrial fibrillation. She converted back to NSR after getting diltiazem. She was started on apixaban and discharged home. Echo showed normal EF. Given ST depression and some chest tightness with afib/RVR, she underwent an ETT-myoview. The myoview showed no evidence for ischemia or infarction, but during the treadmill test she developed self-limited VT with rate in the 160s. This was reviewed with Dr. 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 Cardiac MRI was done showing no evidence for ARVC.   Alison Porter returns for follow-up.  She was last seen in January 2017.  She did move to [place] but is now back in [place].  While gone, she did have a sleep study.  She was diagnosed with sleep apnea but could not afford CPAP.  She has occasional palpitations which are short-lived.  She denies any chest pain, shortness of breath, syncope.  She denies PND.  She does have some edema that is worse with prolonged standing.  She denies any bleeding issues.  Prior CV studies:   The following studies were reviewed today:  Cardiac MRI 03/2012 Impression: 1) No evidence of RV dysplasia Normal RV size and function 2) Normal LV EF 74% no hyperenhancemet 3) Mild LAE 4) Mild MR  Event Monitor 01/2012 No significant arrhythmias  Myoview 01/2012 Normal nuclear images and EF. However patient had significant arrhythmia with  T wave changes in recovery. ? RVOT VT Apparently ECG;s showed to Dr Lovena Le and patient allowed to go home. Discussed with Dr Aundra Dubin who will F/U with patient LV Ejection Fraction: 78%. LV Wall Motion: NL LV Function; NL Wall Motion  Echo 01/2012 EF 55-60%, normal wall motion, mild MR, mild LAE, PASP 37 mmHg   Past Medical History:  Diagnosis Date  . Anemia   . Anxiety   . Anxiety   . Atrial fibrillation ([place])   . Heart murmur   . Hypokalemia   . Other and unspecified hyperlipidemia   . Type II or unspecified type diabetes mellitus without mention of complication, not stated as uncontrolled   . Unspecified essential hypertension   . Unspecified hypothyroidism   1. DM 2. HTN 3. Hypothyroidism 4. Hyperlipidemia 5. Atrial fibrillation: Paroxysmal. Admitted 6/13 with afib/RVR, back to NSR on diltiazem. Echo (7/13): EF 55-60%, mild MR, normal RV.  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 Noted during ETT in 7/13. Cardiac MRI (7/13): EF 74%, normal RV size and systolic function with no evidence for ARVC, mild MR, no delayed enhancement.  7. 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  Surgical Hx: The patient  has a past surgical history that includes Eye surgery (Left).   Current Medications: Current Meds  Medication Sig  . ALPRAZolam (XANAX) 0.25 MG tablet Take 0.25 mg by mouth 3 (three) times daily as needed for anxiety.   . chlorthalidone (HYGROTON)  25 MG tablet Take 0.5 tablets (12.5 mg total) by mouth daily.  Marland Kitchen diltiazem (CARDIZEM CD) 360 MG 24 hr capsule TAKE ONE CAPSULE BY MOUTH ONCE DAILY  . ferrous sulfate 325 (65 FE) MG tablet Take 325 mg by mouth 2 (two) times daily.  . hydrALAZINE (APRESOLINE) 100 MG tablet Take 100 mg by mouth 2 (two) times daily.  . IRON PO Take 1 capsule by mouth 2 (two) times daily.  Marland Kitchen levothyroxine (SYNTHROID, LEVOTHROID) 50 MCG tablet Take  50 mcg by mouth daily.  . metFORMIN (GLUCOPHAGE) 500 MG tablet Take 500 mg by mouth 2 (two) times daily with a meal.   . metoprolol (TOPROL-XL) 200 MG 24 hr tablet Take 200 mg by mouth daily.  Marland Kitchen NOVOLOG MIX 70/30 FLEXPEN (70-30) 100 UNIT/ML Pen 20 Units 2 (two) times daily.  Marland Kitchen omega-3 acid ethyl esters (LOVAZA) 1 G capsule Take 1 g by mouth daily.  Glory Rosebush VERIO test strip 1 each by Other route as needed (testing blood sugar).   . pantoprazole (PROTONIX) 40 MG tablet Take 40 mg by mouth daily as needed (for stomach upset).  . potassium chloride SA (KLOR-CON M20) 20 MEQ tablet Take 1 tablet (20 mEq total) by mouth daily.  . potassium chloride SA (KLOR-CON M20) 20 MEQ tablet Take 1 tablet (20 mEq total) by mouth 2 (two) times daily. Please call and schedule follow up appt [phone]  . pravastatin (PRAVACHOL) 40 MG tablet Take 40 mg by mouth daily.   Alveda Reasons 20 MG TABS tablet TAKE ONE TABLET BY MOUTH ONCE DAILY  . [DISCONTINUED] losartan (COZAAR) 50 MG tablet Take 1 tablet (50 mg total) by mouth 2 (two) times daily.     Allergies:   Codeine phosphate   Social History   Tobacco Use  . Smoking status: Former Smoker    Types: Cigarettes    Last attempt to quit: 07/10/1983    Years since quitting: 34.2  . Smokeless tobacco: Never Used  Substance Use Topics  . Alcohol use: No  . Drug use: No     Family Hx: The patient's family history includes Cancer in her sister; Diabetes in her brother and sister; Hypertension in her mother; Stroke in her mother.  ROS:   Please see the history of present illness.    Review of Systems  Cardiovascular: Positive for leg swelling.   All other systems reviewed and are negative.   EKGs/Labs/Other Test Reviewed:    EKG:  EKG is  ordered today.  The ekg ordered today demonstrates normal sinus rhythm, heart rate 63, left axis deviation, nonspecific ST-T wave changes, QTC 460, no change from prior tracing  Recent Labs: No results found for  requested labs within last 8760 hours.   Recent Lipid Panel Lab Results  Component Value Date/Time   CHOL 175 07/15/2014 02:58 PM   TRIG 188.0 (H) 07/15/2014 02:58 PM   HDL 28.90 (L) 07/15/2014 02:58 PM   CHOLHDL 6 07/15/2014 02:58 PM   LDLCALC 109 (H) 07/15/2014 02:58 PM    Physical Exam:    VS:  BP (!) 158/60   Pulse 70   Ht 5\' 8"  (1.727 m)   Wt 177 lb 1.9 oz (80.3 kg)   LMP 11/10/2012   SpO2 97%   BMI 26.93 kg/m     Wt Readings from Last 3 Encounters:  10/07/17 177 lb 1.9 oz (80.3 kg)  08/05/15 176 lb 12.8 oz (80.2 kg)  02/03/15 172 lb (78 kg)  Physical Exam  Constitutional: She is oriented to person, place, and time. She appears well-developed and well-nourished. No distress.  HENT:  Head: Normocephalic and atraumatic.  Neck: Carotid bruit is not present.  Cardiovascular: Normal rate and regular rhythm.  Murmur heard.  Early systolic murmur is present with a grade of 2/6 at the upper right sternal border. Unchanged from prior exam Pulmonary/Chest: Effort normal. She has no rales.  Abdominal: Soft.  Musculoskeletal: She exhibits no edema.  Neurological: She is alert and oriented to person, place, and time.  Skin: Skin is warm and dry.    ASSESSMENT & PLAN:    #1.  PAF (paroxysmal atrial fibrillation) (HCC) Maintaining normal sinus rhythm.  Recent creatinine 1.040.  Her creatinine clearance is 73.  Continue Rivaroxaban 20 mg daily.  Arrange follow-up BMET, CBC 2 weeks  #2.  VT (ventricular tachycardia) ([place]) This occurred during exercise stress testing some years ago.  Cardiac MRI and ECGs were not suggestive of ARVC.  Continue beta-blocker.  #3.  Essential hypertension Blood pressure above target.  It is usually better controlled at home.  As losartan is on recall, I have suggested that we change her to candesartan.  I have asked her to monitor blood pressures at home and contact us if they should remain elevated.  -DC losartan  -Start candesartan 16 mg  twice daily  -BMET 2 weeks  #4.  Hyperlipidemia, unspecified hyperlipidemia type LDL optimal on most recent lab work.  Continue current Rx.    #5.  Sleep apnea I will have our office contact her to see if we can arrange CPAP.   Dispo:  Return in about 1 year (around 10/08/2018) for Routine Follow Up, w/ Richardson Dopp, PA-C.   Medication Adjustments/Labs and Tests Ordered: Current medicines are reviewed at length with the patient today.  Concerns regarding medicines are outlined above.  Tests Ordered: Orders Placed This Encounter  Procedures  . Basic Metabolic Panel (BMET)  . CBC  . EKG 12-Lead   Medication Changes: Meds ordered this encounter  Medications  . candesartan (ATACAND) 16 MG tablet    Sig: Take 1 tablet (16 mg total) by mouth 2 (two) times daily.    Dispense:  180 tablet    Refill:  3    CHANGE IN THERAPY ; [address]    Signed, Richardson Dopp, PA-C  10/07/2017 2:58 PM    [facility] [place], [place], [place]  [address] Phone: [phone]; Fax: [phone]

## 2017-10-07 ENCOUNTER — Encounter: Payer: Self-pay | Admitting: Physician Assistant

## 2017-10-07 ENCOUNTER — Telehealth: Payer: Self-pay | Admitting: Physician Assistant

## 2017-10-07 ENCOUNTER — Ambulatory Visit: Payer: BC Managed Care – PPO | Admitting: Physician Assistant

## 2017-10-07 VITALS — BP 158/60 | HR 70 | Ht 68.0 in | Wt 177.1 lb

## 2017-10-07 DIAGNOSIS — I10 Essential (primary) hypertension: Secondary | ICD-10-CM | POA: Diagnosis not present

## 2017-10-07 DIAGNOSIS — I472 Ventricular tachycardia, unspecified: Secondary | ICD-10-CM

## 2017-10-07 DIAGNOSIS — E785 Hyperlipidemia, unspecified: Secondary | ICD-10-CM

## 2017-10-07 DIAGNOSIS — G4733 Obstructive sleep apnea (adult) (pediatric): Secondary | ICD-10-CM | POA: Diagnosis not present

## 2017-10-07 DIAGNOSIS — I480 Paroxysmal atrial fibrillation: Secondary | ICD-10-CM

## 2017-10-07 MED ORDER — CANDESARTAN CILEXETIL 16 MG PO TABS: 16.0000 mg | ORAL_TABLET | Freq: Two times a day (BID) | ORAL | 3 refills | Status: DC

## 2017-10-07 NOTE — Telephone Encounter (Signed)
Ok.  Thank you! Richardson Dopp, PA-C    10/07/2017 10:48 PM

## 2017-10-07 NOTE — Telephone Encounter (Signed)
New message     Patient calling to provide [facility] name of center in [place], [place] where she had sleep study per request of Richardson Dopp. The number is [phone]. The patient was unsure of the name.

## 2017-10-07 NOTE — Patient Instructions (Signed)
Medication Instructions:  1. STOP LOSARTAN  2. START CANDESARTAN 16 MG TWICE DAILY  Labwork: IN 2-3 WEEKS YOU WILL NEED LAB WORK TO BE DONE (BMET, CBC)  Testing/Procedures: NONE ORDERED TODAY  Follow-Up: Your physician wants you to follow-up in: [place], [facility] . You will receive a reminder letter in the mail two months in advance. If you don't receive a letter, please call our office to schedule the follow-up appointment.   Any Other Special Instructions Will Be Listed Below (If Applicable). I WILL HAVE NINA JONES, CMA CALL YOU CAN CHECK ON YOUR SLEEP STUDY/CPAP THAT YOU HAD DONE IN [place], [place].     If you need a refill on your cardiac medications before your next appointment, please call your pharmacy.

## 2017-10-07 NOTE — Telephone Encounter (Signed)
Called [phone] ([facility]) and they have no record in the new or old system for this patient. Per [employer] weaver ok to get new sleep study if patient is agreeable. If patient is not agreeable patient will be responsible for getting [place] her past sleep study. Patient is agreeable to calling her cardiologist in [facility] to obtain sleep study records.

## 2017-10-28 ENCOUNTER — Other Ambulatory Visit: Payer: BC Managed Care – PPO

## 2017-11-04 ENCOUNTER — Other Ambulatory Visit: Payer: Self-pay

## 2017-11-04 ENCOUNTER — Other Ambulatory Visit: Payer: BC Managed Care – PPO

## 2017-11-05 MED ORDER — POTASSIUM CHLORIDE CRYS ER 20 MEQ PO TBCR: 20.0000 meq | EXTENDED_RELEASE_TABLET | Freq: Every day | ORAL | 6 refills | Status: DC

## 2017-11-05 NOTE — Telephone Encounter (Signed)
I will route to Richardson Dopp, PA for further advice. Pt has cancelled her lab appt x 2 to check BMET, is now scheduled for 5/6/119 for BMET. Will check with PA as to if ok to continue with dose for K+.

## 2017-11-11 ENCOUNTER — Other Ambulatory Visit: Payer: BC Managed Care – PPO | Admitting: *Deleted

## 2017-11-11 DIAGNOSIS — I10 Essential (primary) hypertension: Secondary | ICD-10-CM

## 2017-11-11 DIAGNOSIS — I480 Paroxysmal atrial fibrillation: Secondary | ICD-10-CM

## 2017-11-11 LAB — BASIC METABOLIC PANEL
BUN/Creatinine Ratio: 15 (ref 12–28)
BUN: 15 mg/dL (ref 8–27)
CO2: 24 mmol/L (ref 20–29)
Calcium: 10 mg/dL (ref 8.7–10.3)
Chloride: 103 mmol/L (ref 96–106)
Creatinine, Ser: 1.03 mg/dL — ABNORMAL HIGH (ref 0.57–1.00)
GFR calc Af Amer: 68 mL/min/{1.73_m2} (ref >59)
GFR calc non Af Amer: 59 mL/min/{1.73_m2} — ABNORMAL LOW (ref >59)
Glucose: 166 mg/dL — ABNORMAL HIGH (ref 65–99)
Potassium: 3.8 mmol/L (ref 3.5–5.2)
Sodium: 143 mmol/L (ref 134–144)

## 2017-11-11 LAB — CBC
Hematocrit: 38.4 % (ref 34.0–46.6)
Hemoglobin: 12.6 g/dL (ref 11.1–15.9)
MCH: 27.1 pg (ref 26.6–33.0)
MCHC: 32.8 g/dL (ref 31.5–35.7)
MCV: 83 fL (ref 79–97)
Platelets: 263 10*3/uL (ref 150–379)
RBC: 4.65 x10E6/uL (ref 3.77–5.28)
RDW: 14.4 % (ref 12.3–15.4)
WBC: 5.1 10*3/uL (ref 3.4–10.8)

## 2017-11-22 ENCOUNTER — Other Ambulatory Visit: Payer: Self-pay | Admitting: Family Medicine

## 2017-11-22 DIAGNOSIS — Z1231 Encounter for screening mammogram for malignant neoplasm of breast: Secondary | ICD-10-CM

## 2017-12-19 ENCOUNTER — Ambulatory Visit
Admission: RE | Admit: 2017-12-19 | Discharge: 2017-12-19 | Disposition: A | Discharge status: Home / Self Care | Payer: BC Managed Care – PPO | Source: Ambulatory Visit | Attending: Physician Assistant | Admitting: Physician Assistant

## 2017-12-19 ENCOUNTER — Other Ambulatory Visit: Payer: Self-pay | Admitting: Physician Assistant

## 2017-12-19 DIAGNOSIS — M898X1 Other specified disorders of bone, shoulder: Secondary | ICD-10-CM

## 2017-12-19 DIAGNOSIS — R52 Pain, unspecified: Secondary | ICD-10-CM

## 2017-12-24 ENCOUNTER — Ambulatory Visit: Payer: BC Managed Care – PPO

## 2018-01-08 ENCOUNTER — Ambulatory Visit: Payer: BC Managed Care – PPO

## 2018-02-05 ENCOUNTER — Ambulatory Visit
Admission: RE | Admit: 2018-02-05 | Discharge: 2018-02-05 | Disposition: A | Discharge status: Home / Self Care | Payer: BC Managed Care – PPO | Source: Ambulatory Visit | Attending: Family Medicine | Admitting: Family Medicine

## 2018-02-05 DIAGNOSIS — Z1231 Encounter for screening mammogram for malignant neoplasm of breast: Secondary | ICD-10-CM

## 2018-08-07 ENCOUNTER — Ambulatory Visit (HOSPITAL_COMMUNITY)
Admission: EM | Admit: 2018-08-07 | Discharge: 2018-08-07 | Disposition: A | Discharge status: Home / Self Care | Payer: BC Managed Care – PPO | Attending: Family Medicine | Admitting: Family Medicine

## 2018-08-07 ENCOUNTER — Ambulatory Visit (INDEPENDENT_AMBULATORY_CARE_PROVIDER_SITE_OTHER): Payer: BC Managed Care – PPO

## 2018-08-07 ENCOUNTER — Encounter (HOSPITAL_COMMUNITY): Payer: Self-pay | Admitting: Emergency Medicine

## 2018-08-07 DIAGNOSIS — S29012A Strain of muscle and tendon of back wall of thorax, initial encounter: Secondary | ICD-10-CM | POA: Insufficient documentation

## 2018-08-07 DIAGNOSIS — R109 Unspecified abdominal pain: Secondary | ICD-10-CM | POA: Insufficient documentation

## 2018-08-07 DIAGNOSIS — R1032 Left lower quadrant pain: Secondary | ICD-10-CM

## 2018-08-07 LAB — POCT URINALYSIS DIP (DEVICE)
Bilirubin Urine: NEGATIVE
Glucose, UA: NEGATIVE mg/dL
Hgb urine dipstick: NEGATIVE
Ketones, ur: NEGATIVE mg/dL
Leukocytes, UA: NEGATIVE
Nitrite: NEGATIVE
Protein, ur: NEGATIVE mg/dL
Specific Gravity, Urine: 1.01 (ref 1.005–1.030)
Urobilinogen, UA: 0.2 mg/dL (ref 0.0–1.0)
pH: 5 (ref 5.0–8.0)

## 2018-08-07 MED ORDER — TIZANIDINE HCL 4 MG PO TABS: 4.0000 mg | ORAL_TABLET | Freq: Four times a day (QID) | ORAL | 0 refills | Status: DC | PRN

## 2018-08-07 MED ORDER — METHYLPREDNISOLONE 4 MG PO TBPK: ORAL_TABLET | ORAL | 0 refills | Status: DC

## 2018-08-07 NOTE — ED Triage Notes (Signed)
Pt states she has L flank pain and back pain x3 weeks, went to her PCP and had a urine sample and culture done which was clear and also was given antibiotic, pt has finished the antibiotic and states the pain is still there.

## 2018-08-07 NOTE — Discharge Instructions (Signed)
Take the prednisone as directed This is an antiinflammatory Watch your blood sugar on the medrol. It might bump up slightly You cannot take the ibuprofen we discussed due to Xarelto! Take the muscle relaxer as needed Ice/heat to area Expect improvement in a few days See your PCP if you fail to improve

## 2018-08-07 NOTE — ED Notes (Signed)
Patient requested note for daughter-daughter drove patient here today

## 2018-08-07 NOTE — ED Provider Notes (Signed)
[facility]    CSN: [id number] Arrival date & time: 08/07/18  0920     History   Chief Complaint Chief Complaint  Patient presents with  . Flank Pain    HPI CARRA BRINDLEY is a 63 y.o. female.   HPI   L flank pain for 3 weeks Comes in waves More severe this am Went to her PCP and they did a UA a Culture ( neg) and treated with antibiotics No injury or activity to provoke No history of kidney stone or infection NO history of spine problems NO urinary symptom such as frequency or dysuria.  Has not noted change in urine or hematuria Has diabetes, HTN, heart disease all well controlled Has been treating this with acetaminophen, no lasting relief.  Past Medical History:  Diagnosis Date  . Anemia   . Anxiety   . Anxiety   . Atrial fibrillation ([place])   . Heart murmur   . Hypokalemia   . Other and unspecified hyperlipidemia   . Type II or unspecified type diabetes mellitus without mention of complication, not stated as uncontrolled   . Unspecified essential hypertension   . Unspecified hypothyroidism     Patient Active Problem List   Diagnosis Date Noted  . VT (ventricular tachycardia) ([place]) 02/06/2012  . PAF (paroxysmal atrial fibrillation) ([place]) 12/29/2011  . HTN (hypertension) 12/29/2011  . DM (diabetes mellitus) ([place]) 12/29/2011  . Hyperlipidemia 12/29/2011  . Hypothyroidism 12/29/2011    Past Surgical History:  Procedure Laterality Date  . EYE SURGERY Left     OB History   No obstetric history on file.      Home Medications    Prior to Admission medications   Medication Sig Start Date End Date Taking? Authorizing Provider  ALPRAZolam (XANAX) 0.25 MG tablet Take 0.25 mg by mouth 3 (three) times daily as needed for anxiety.  02/12/12   [provider]  candesartan (ATACAND) 16 MG tablet Take 1 tablet (16 mg total) by mouth 2 (two) times daily. 10/07/17   Richardson Dopp T, PA-C  chlorthalidone (HYGROTON) 25 MG tablet Take 0.5 tablets  (12.5 mg total) by mouth daily. 02/04/15   Richardson Dopp T, PA-C  diltiazem (CARDIZEM CD) 360 MG 24 hr capsule TAKE ONE CAPSULE BY MOUTH ONCE DAILY 02/21/16   Larey Dresser, MD  ferrous sulfate 325 (65 FE) MG tablet Take 325 mg by mouth 2 (two) times daily.    [provider]  hydrALAZINE (APRESOLINE) 100 MG tablet Take 100 mg by mouth 2 (two) times daily.    [provider]  IRON PO Take 1 capsule by mouth 2 (two) times daily.    [provider]  levothyroxine (SYNTHROID, LEVOTHROID) 50 MCG tablet Take 50 mcg by mouth daily.    [provider]  metFORMIN (GLUCOPHAGE) 500 MG tablet Take 500 mg by mouth 2 (two) times daily with a meal.     [provider]  methylPREDNISolone (MEDROL DOSEPAK) 4 MG TBPK tablet tad 08/07/18   Raylene Everts, MD  metoprolol (TOPROL-XL) 200 MG 24 hr tablet Take 200 mg by mouth daily.    [provider]  NOVOLOG MIX 70/30 FLEXPEN (70-30) 100 UNIT/ML Pen 20 Units 2 (two) times daily. 10/26/13   [provider]  omega-3 acid ethyl esters (LOVAZA) 1 G capsule Take 1 g by mouth daily.    [provider]  [facility] VERIO test strip 1 each by Other route as needed (testing blood sugar).  10/30/14   [provider]  pantoprazole (PROTONIX) 40 MG tablet Take 40 mg by mouth daily as needed (for stomach upset).    [provider]  potassium chloride SA (KLOR-CON M20) 20 MEQ tablet Take 1 tablet (20 mEq total) by mouth daily. 11/05/17   Richardson Dopp T, PA-C  pravastatin (PRAVACHOL) 40 MG tablet Take 40 mg by mouth daily.  01/26/15   [provider]  tiZANidine (ZANAFLEX) 4 MG tablet Take 1-2 tablets (4-8 mg total) by mouth every 6 (six) hours as needed for muscle spasms. 08/07/18   Raylene Everts, MD  XARELTO 20 MG TABS tablet TAKE ONE TABLET BY MOUTH ONCE DAILY 08/15/15   Larey Dresser, MD    Family History Family History  Problem Relation Age of Onset  . Hypertension Mother     . Stroke Mother   . Cancer Sister        sarcoma  . Diabetes Sister   . Diabetes Brother        x 2  . Breast cancer Neg Hx     Social History Social History   Tobacco Use  . Smoking status: Former Smoker    Types: Cigarettes    Last attempt to quit: 07/10/1983    Years since quitting: 35.1  . Smokeless tobacco: Never Used  Substance Use Topics  . Alcohol use: No  . Drug use: No     Allergies   Codeine phosphate   Review of Systems Review of Systems  Constitutional: Negative for chills and fever.  HENT: Negative for congestion, ear pain and sore throat.   Eyes: Negative for pain and visual disturbance.  Respiratory: Negative for cough and shortness of breath.   Cardiovascular: Negative for chest pain and palpitations.  Gastrointestinal: Negative for abdominal pain and vomiting.  Genitourinary: Positive for flank pain. Negative for difficulty urinating, dysuria, frequency and hematuria.  Musculoskeletal: Positive for back pain. Negative for arthralgias.  Skin: Negative for color change and rash.  Neurological: Negative for seizures and syncope.  All other systems reviewed and are negative.    Physical Exam Triage Vital Signs ED Triage Vitals [08/07/18 1024]  Enc Vitals Group     BP (!) 180/82     Pulse Rate 69     Resp 18     Temp 98.4 F (36.9 C)     Temp src      SpO2 93 %     Weight      Height      Head Circumference      Peak Flow      Pain Score 6     Pain Loc      Pain Edu?      Excl. in [place]?    No data found.  Updated Vital Signs BP (!) 180/82   Pulse 69   Temp 98.4 F (36.9 C)   Resp 18   LMP 11/10/2012   SpO2 93%       Physical Exam Constitutional:      General: She is not in acute distress.    Appearance: She is well-developed and normal weight. She is not ill-appearing.  HENT:     Head: Normocephalic and atraumatic.  Eyes:     Conjunctiva/sclera: Conjunctivae normal.     Pupils: Pupils are equal, round, and reactive to  light.  Neck:     Musculoskeletal: Normal range of motion. No muscular tenderness.  Cardiovascular:     Rate and Rhythm: Normal rate  and regular rhythm.     Heart sounds: Murmur present.     Comments: Faint systolic murmur Pulmonary:     Effort: Pulmonary effort is normal. No respiratory distress.     Breath sounds: Normal breath sounds.  Abdominal:     General: Abdomen is protuberant. Bowel sounds are normal. There is no distension.     Palpations: Abdomen is soft. There is no hepatomegaly or splenomegaly.     Tenderness: There is abdominal tenderness. There is left CVA tenderness.     Comments: Abdominal obesity.  Tenderness to palpation left deep mid-abdoman  Musculoskeletal: Normal range of motion.  Lymphadenopathy:     Cervical: No cervical adenopathy.  Skin:    General: Skin is warm and dry.  Neurological:     General: No focal deficit present.     Mental Status: She is alert.     Gait: Gait normal.  Psychiatric:        Mood and Affect: Mood normal.      UC Treatments / Results  Labs (all labs ordered are listed, but only abnormal results are displayed) Labs Reviewed  POCT URINALYSIS DIP (DEVICE)    EKG None  Radiology Dg Abdomen Acute W/chest  Result Date: 08/07/2018 CLINICAL DATA:  Left abdominal and flank pain EXAM: DG ABDOMEN ACUTE W/ 1V CHEST COMPARISON:  12/19/2017 FINDINGS: Moderate stool volume without obstruction or rectal distention. Coarse pelvic calcification, usually uterine. No evidence of urolithiasis. No pneumoperitoneum or concerning mass effect. Normal heart size and mediastinal contours. No acute infiltrate or edema. No effusion or pneumothorax. No acute osseous findings. IMPRESSION: 1. Moderate stool retention without obstruction. 2. Negative chest. Electronically Signed   By: Monte Fantasia M.D.   On: 08/07/2018 11:16    Procedures Procedures (including critical care time)  Medications Ordered in UC Medications - No data to  display  Initial Impression / Assessment and Plan / UC Course  I have reviewed the triage vital signs and the nursing notes.  Pertinent labs & imaging results that were available during my care of the patient were reviewed by me and considered in my medical decision making (see chart for details).    Urine is normal.  X-rays are normal.  I think kidney stone is unlikely.  She does has waves and pain, and some radiation into her left lower quadrant which would be suggestive of kidney stone, however on physical examination she has tenderness and tightness in the lumbar muscles in the area where the pain is present.  My working diagnosis at this point is a musculoskeletal lumbar pain.  We will treat for same, and follow.  She has instructed to return if not improved.  Final Clinical Impressions(s) / UC Diagnoses   Final diagnoses:  Left flank pain  Muscle strain of left upper back, initial encounter     Discharge Instructions     Take the prednisone as directed This is an antiinflammatory Watch your blood sugar on the medrol. It might bump up slightly You cannot take the ibuprofen we discussed due to Xarelto! Take the muscle relaxer as needed Ice/heat to area Expect improvement in a few days See your PCP if you fail to improve    ED Prescriptions    Medication Sig Dispense Auth. Provider   tiZANidine (ZANAFLEX) 4 MG tablet Take 1-2 tablets (4-8 mg total) by mouth every 6 (six) hours as needed for muscle spasms. 21 tablet Raylene Everts, MD   methylPREDNISolone (MEDROL DOSEPAK) 4 MG TBPK tablet  tad 21 tablet Raylene Everts, MD     Controlled Substance Prescriptions [place] Controlled Substance Registry consulted? Not Applicable   Raylene Everts, MD 08/07/18 1430

## 2018-08-28 IMAGING — CR DG SHOULDER 2+V*R*
3 series · 3 of 3 positions shown · non-contrast
Comparison: None.

CLINICAL DATA: Pain after a fall several months ago.

EXAM:
RIGHT SHOULDER - 2+ VIEW

[w shoulder ap external righ *]
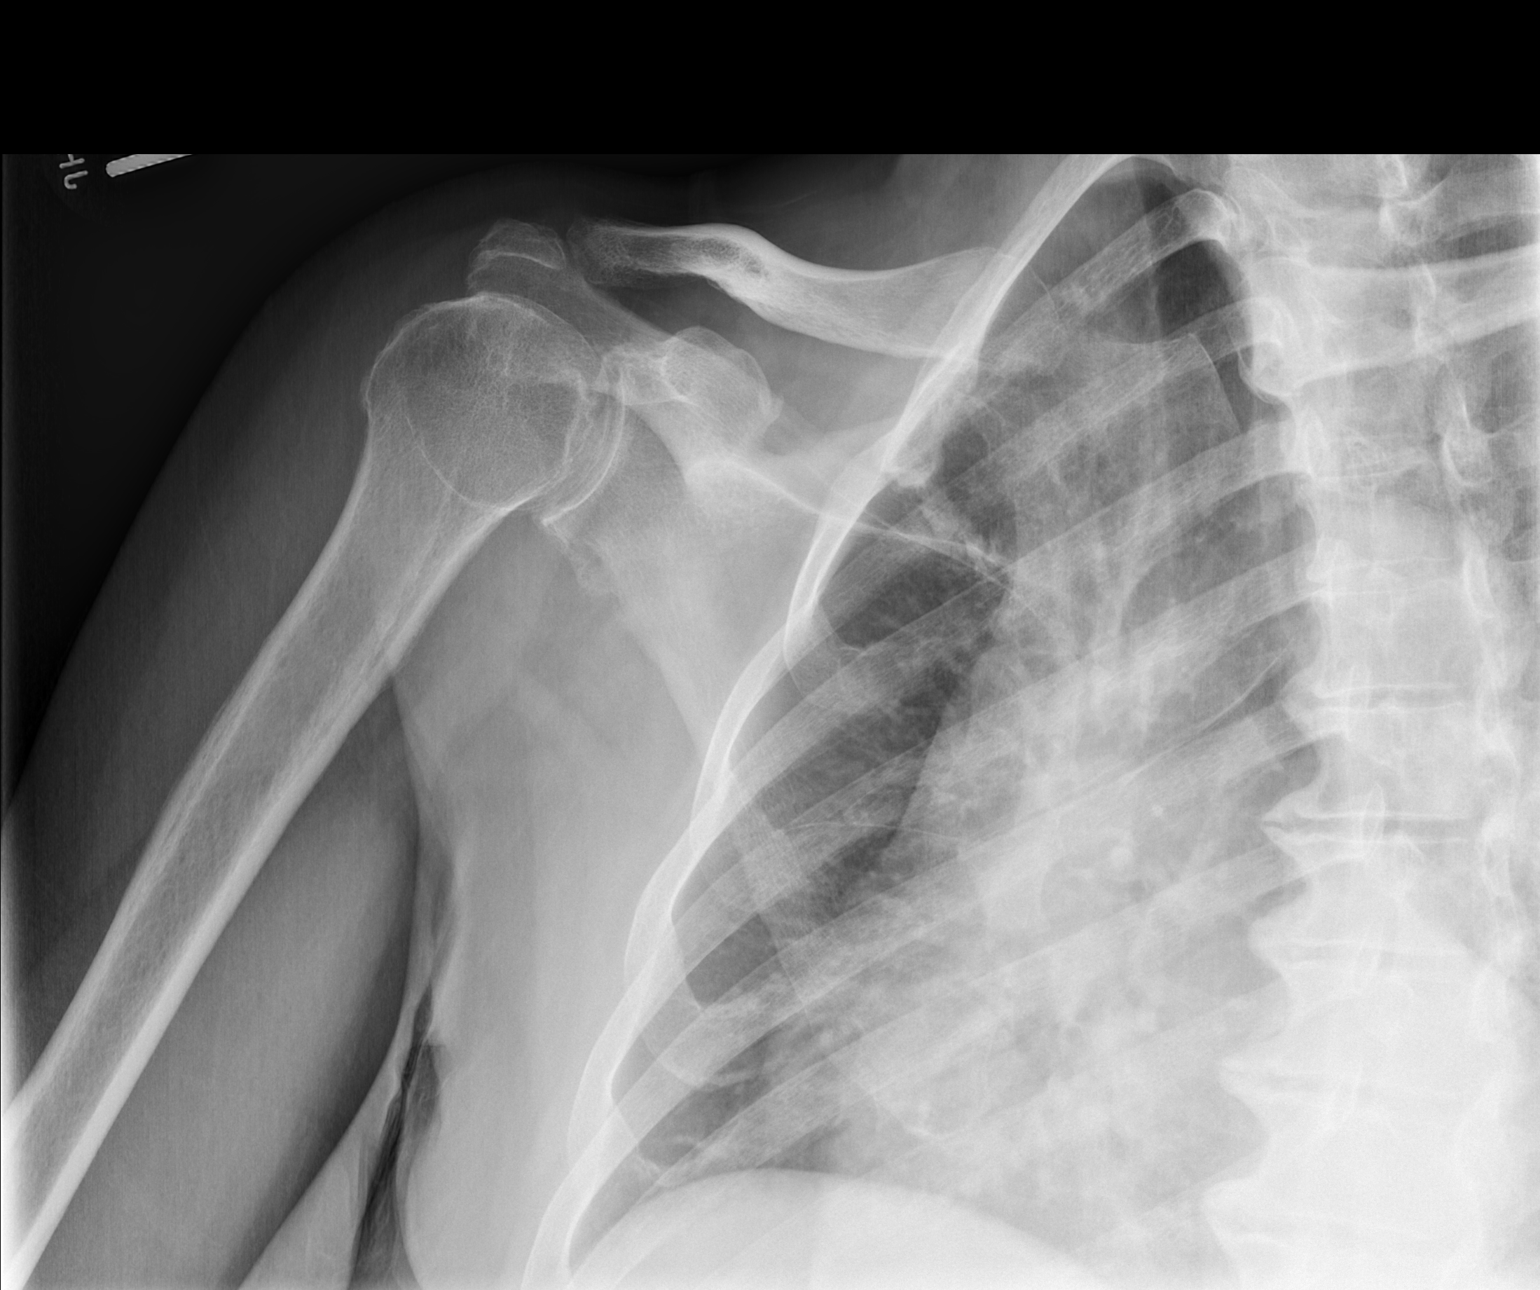

[w shoulder y view right *]
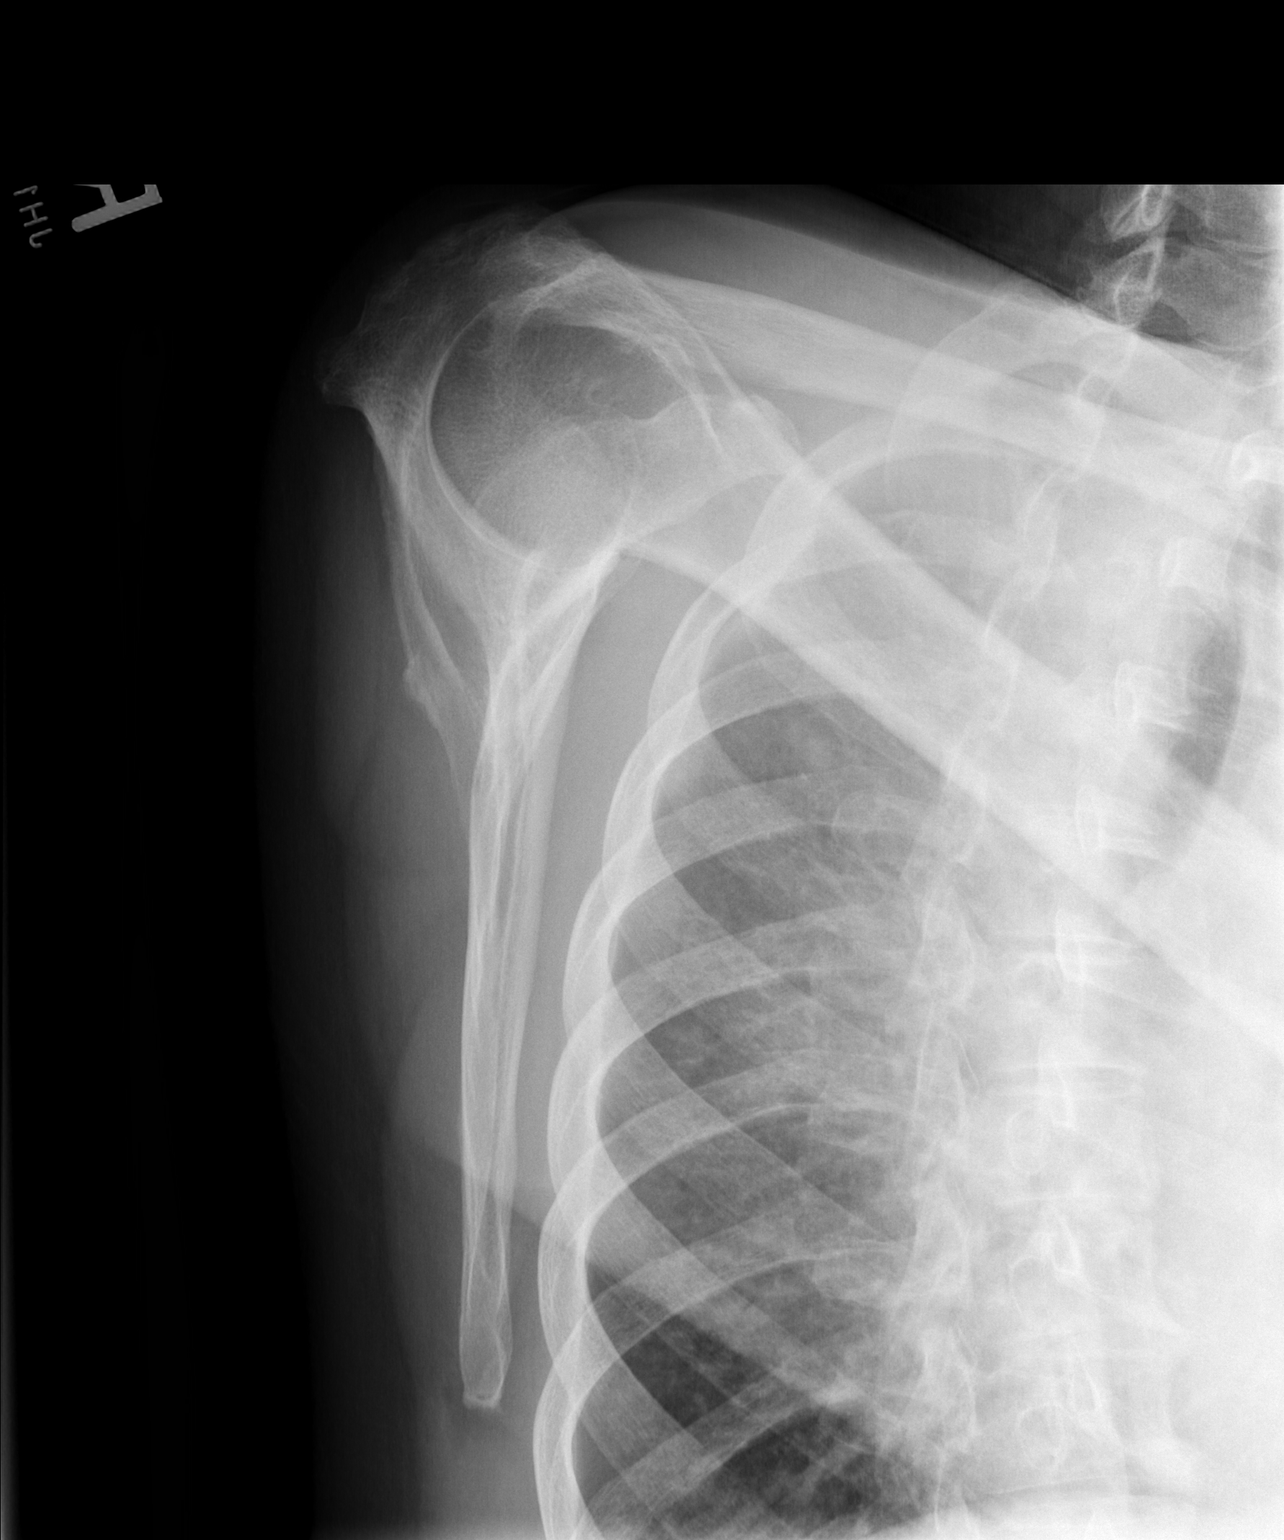

[w shoulder axillary right *]
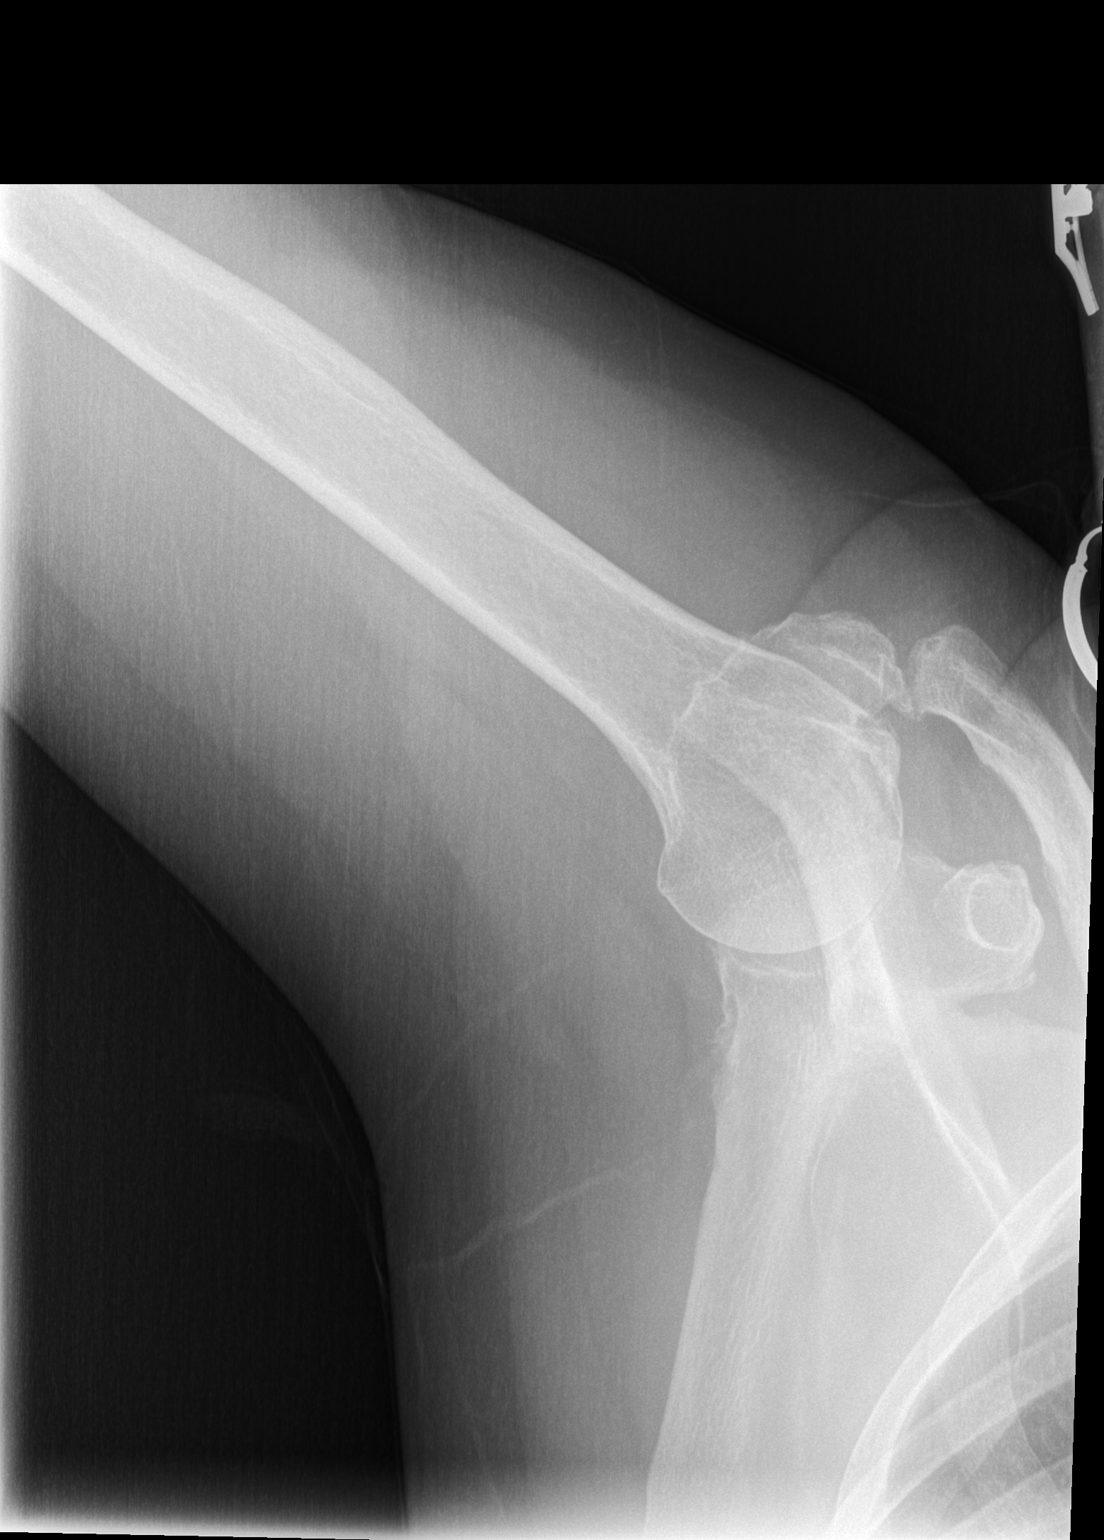

[3 of 3 positions shown; findings below may reference images not displayed]

FINDINGS: Limited views of the right chest are normal. No fractures or
dislocations. Mild AC joint degenerative changes. No other acute
abnormalities.
IMPRESSION: Mild AC joint degenerative changes.  No fractures.

## 2018-09-26 ENCOUNTER — Encounter: Payer: Self-pay | Admitting: Physician Assistant

## 2018-10-07 ENCOUNTER — Ambulatory Visit: Payer: BC Managed Care – PPO | Admitting: Physician Assistant

## 2018-10-28 ENCOUNTER — Telehealth: Payer: Self-pay | Admitting: Physician Assistant

## 2018-10-28 NOTE — Telephone Encounter (Signed)
VIDEO/Doximity visit on 11/05/2018. Patient has smartphone.     PHONE Call to obtain consent   -  10/28/2018        Virtual Visit Pre-Appointment Phone Call  "(Name), I am calling you today to discuss your upcoming appointment. We are currently trying to limit exposure to the virus that causes COVID-19 by seeing patients at home rather than in the office."  1. "What is the BEST phone number to call the day of the visit?" - include this in appointment notes  2. Do you have or have access to (through a family member/friend) a smartphone with video capability that we can use for your visit?" a. If yes - list this number in appt notes as cell (if different from BEST phone #) and list the appointment type as a VIDEO visit in appointment notes b. If no - list the appointment type as a PHONE visit in appointment notes  3. Confirm consent - "In the setting of the current Covid19 crisis, you are scheduled for a (phone or video) visit with your provider on (date) at (time).  Just as we do with many in-office visits, in order for you to participate in this visit, we must obtain consent.  If you'd like, I can send this to your mychart (if signed up) or email for you to review.  Otherwise, I can obtain your verbal consent now.  All virtual visits are billed to your insurance company just like a normal visit would be.  By agreeing to a virtual visit, we'd like you to understand that the technology does not allow for your provider to perform an examination, and thus may limit your provider's ability to fully assess your condition. If your provider identifies any concerns that need to be evaluated in person, we will make arrangements to do so.  Finally, though the technology is pretty good, we cannot assure that it will always work on either your or our end, and in the setting of a video visit, we may have to convert it to a phone-only visit.  In either situation, we cannot ensure that we have a secure  connection.  Are you willing to proceed?" STAFF: Did the patient verbally acknowledge consent to telehealth visit? Document YES/NO here: YES  4. Advise patient to be prepared - "Two hours prior to your appointment, go ahead and check your blood pressure, pulse, oxygen saturation, and your weight (if you have the equipment to check those) and write them all down. When your visit starts, your provider will ask you for this information. If you have an Apple Watch or Kardia device, please plan to have heart rate information ready on the day of your appointment. Please have a pen and paper handy nearby the day of the visit as well."  5. Give patient instructions for MyChart download to smartphone OR Doximity/Doxy.me as below if video visit (depending on what platform provider is using)  6. Inform patient they will receive a phone call 15 minutes prior to their appointment time (may be from unknown caller ID) so they should be prepared to answer    TELEPHONE CALL NOTE  Alison Porter has been deemed a candidate for a follow-up tele-health visit to limit community exposure during the Covid-19 pandemic. I spoke with the patient via phone to ensure availability of phone/video source, confirm preferred email & phone number, and discuss instructions and expectations.  I reminded Alison Porter to be prepared with any vital sign and/or heart  rhythm information that could potentially be obtained via home monitoring, at the time of her visit. I reminded Alison Porter to expect a phone call prior to her visit.  [name] 10/28/2018 11:24 AM   INSTRUCTIONS FOR DOWNLOADING THE MYCHART APP TO SMARTPHONE  - The patient must first make sure to have activated MyChart and know their login information - If Apple, go to [employer] and type in MyChart in the search bar and download the app. If Android, ask patient to go to [employer] and type in [place] in the search bar and download the app. The app is free but  as with any other app downloads, their phone may require them to verify saved payment information or Apple/Android password.  - The patient will need to then log into the app with their MyChart username and password, and select [facility] as their healthcare provider to link the account. When it is time for your visit, go to the MyChart app, find appointments, and click Begin Video Visit. Be sure to Select Allow for your device to access the Microphone and Camera for your visit. You will then be connected, and your provider will be with you shortly.  **If they have any issues connecting, or need assistance please contact MyChart service desk [phone])**  **If using a computer, in order to ensure the best quality for their visit they will need to use either of the following Internet Browsers: [employer], or Google Chrome**  IF USING DOXIMITY or DOXY.ME - The patient will receive a link just prior to their visit by text.     FULL LENGTH CONSENT FOR TELE-HEALTH VISIT   I hereby voluntarily request, consent and authorize [facility] and its employed or contracted physicians, physician assistants, nurse practitioners or other licensed health care professionals (the Practitioner), to provide me with telemedicine health care services (the Services") as deemed necessary by the treating Practitioner. I acknowledge and consent to receive the Services by the Practitioner via telemedicine. I understand that the telemedicine visit will involve communicating with the Practitioner through live audiovisual communication technology and the disclosure of certain medical information by electronic transmission. I acknowledge that I have been given the opportunity to request an in-person assessment or other available alternative prior to the telemedicine visit and am voluntarily participating in the telemedicine visit.  I understand that I have the right to withhold or withdraw my consent to  the use of telemedicine in the course of my care at any time, without affecting my right to future care or treatment, and that the Practitioner or I may terminate the telemedicine visit at any time. I understand that I have the right to inspect all information obtained and/or recorded in the course of the telemedicine visit and may receive copies of available information for a reasonable fee.  I understand that some of the potential risks of receiving the Services via telemedicine include:   Delay or interruption in medical evaluation due to technological equipment failure or disruption;  Information transmitted may not be sufficient (e.g. poor resolution of images) to allow for appropriate medical decision making by the Practitioner; and/or   In rare instances, security protocols could fail, causing a breach of personal health information.  Furthermore, I acknowledge that it is my responsibility to provide information about my medical history, conditions and care that is complete and accurate to the best of my ability. I acknowledge that Practitioner's advice, recommendations, and/or decision may be based on factors  not within their control, such as incomplete or inaccurate data provided by me or distortions of diagnostic images or specimens that may result from electronic transmissions. I understand that the practice of medicine is not an exact science and that Practitioner makes no warranties or guarantees regarding treatment outcomes. I acknowledge that I will receive a copy of this consent concurrently upon execution via email to the email address I last provided but may also request a printed copy by calling the office of [facility].    I understand that my insurance will be billed for this visit.   I have read or had this consent read to me.  I understand the contents of this consent, which adequately explains the benefits and risks of the Services being provided via telemedicine.   I have  been provided ample opportunity to ask questions regarding this consent and the Services and have had my questions answered to my satisfaction.  I give my informed consent for the services to be provided through the use of telemedicine in my medical care  By participating in this telemedicine visit I agree to the above.

## 2018-11-04 NOTE — Progress Notes (Signed)
Virtual Visit via Video Note   This visit type was conducted due to national recommendations for restrictions regarding the COVID-19 Pandemic (e.g. social distancing) in an effort to limit this patient's exposure and mitigate transmission in our community.  Due to her co-morbid illnesses, this patient is at least at moderate risk for complications without adequate follow up.  This format is felt to be most appropriate for this patient at this time.  All issues noted in this document were discussed and addressed.  A limited physical exam was performed with this format.  Please refer to the patient's chart for her consent to telehealth for [facility].   Evaluation Performed:  Follow-up visit  Date:  11/05/2018   ID:  Alison Porter, Alison Porter Dec 06, 1955, MRN [id number]  Patient Location: Home Provider Location: Home  PCP:  Alroy Dust, Carlean Jews.[name], MD  Cardiologist:   I will follow along with Dr. Burt Knack Electrophysiologist:  None   Chief Complaint:  FU on AFib  History of Present Illness:    MEAH JIRON is a 63 y.o. female with paroxysmal atrial fibrillation, VT during an exercise test, sleep apnea.  She was admitted in 12/2011 with AF with RVR that was symptomatic (tachypalpitations). She had no prior documented atrial fibrillation. She converted back to NSR after getting diltiazem. She was started on apixaban and discharged home. Echo showed normal EF. Given ST depression and some chest tightness with afib/RVR, she underwent an ETT-myoview. The myoview showed no evidence for ischemia or infarction, but during the treadmill test she developed self-limited VT with rate in the 160s. This was reviewed with Dr. 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 Cardiac MRI was done showing no evidence for ARVC.  She has been managed with beta-blocker and calcium channel blocker therapy.  She was last seen in 10/2017.    Today, she notes she is doing well.  She has not had any  chest discomfort or shortness.  She has not had orthopnea or paroxysmal nocturnal dyspnea or significant lower extremity swelling.  She does occasionally note some dizziness.  This seems to be associated with a low diastolic blood pressure.  She does report one episode of palpitations but no other recurrence.   The patient does not have symptoms concerning for COVID-19 infection (fever, chills, cough, or new shortness of breath).    Past Medical History:  Diagnosis Date  . Anemia   . Anxiety   . Anxiety   . Atrial fibrillation ([place])   . Heart murmur   . Hypokalemia   . Other and unspecified hyperlipidemia   . Type II or unspecified type diabetes mellitus without mention of complication, not stated as uncontrolled   . Unspecified essential hypertension   . Unspecified hypothyroidism   1. DM 2. HTN 3. Hypothyroidism 4. Hyperlipidemia 5. Atrial fibrillation: Paroxysmal.  Admitted 6/13 with afib/RVR, back to NSR on diltiazem.  Echo (7/13): EF 55-60%, mild MR, normal RV.   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  Noted during ETT in 7/13.  Cardiac MRI (7/13): EF 74%, normal RV size and systolic function with no evidence for ARVC, mild MR, no delayed enhancement.   7. 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    Past Surgical History:  Procedure Laterality Date  . EYE SURGERY Left      Current Meds  Medication Sig  . ALPRAZolam (XANAX) 0.25 MG tablet Take 0.25 mg by  mouth 3 (three) times daily as needed for anxiety.   . chlorthalidone (HYGROTON) 25 MG tablet Take 0.5 tablets (12.5 mg total) by mouth daily.  Marland Kitchen diltiazem (CARDIZEM CD) 360 MG 24 hr capsule TAKE ONE CAPSULE BY MOUTH ONCE DAILY  . ferrous sulfate 325 (65 FE) MG tablet Take 325 mg by mouth 2 (two) times daily.  . hydrALAZINE (APRESOLINE) 100 MG tablet Take 100 mg by mouth 2 (two) times daily.  Marland Kitchen levothyroxine (SYNTHROID,  LEVOTHROID) 50 MCG tablet Take 50 mcg by mouth daily.  . metFORMIN (GLUCOPHAGE) 500 MG tablet Take 500 mg by mouth 2 (two) times daily with a meal.   . metoprolol (TOPROL-XL) 200 MG 24 hr tablet Take 200 mg by mouth daily.  Marland Kitchen NOVOLOG MIX 70/30 FLEXPEN (70-30) 100 UNIT/ML Pen Patient inject  33 units in the morning and 25 units in the evening at bedtime  . omega-3 acid ethyl esters (LOVAZA) 1 G capsule Take 1 g by mouth daily.  Glory Rosebush VERIO test strip 1 each by Other route as needed (testing blood sugar).   . pantoprazole (PROTONIX) 40 MG tablet Take 40 mg by mouth daily as needed (for stomach upset).  . potassium chloride SA (KLOR-CON M20) 20 MEQ tablet Take 1 tablet (20 mEq total) by mouth daily.  . pravastatin (PRAVACHOL) 40 MG tablet Take 40 mg by mouth daily.   Alveda Reasons 20 MG TABS tablet TAKE ONE TABLET BY MOUTH ONCE DAILY     Allergies:   Codeine phosphate   Social History   Tobacco Use  . Smoking status: Former Smoker    Types: Cigarettes    Last attempt to quit: 07/10/1983    Years since quitting: 35.3  . Smokeless tobacco: Never Used  Substance Use Topics  . Alcohol use: No  . Drug use: No     Family Hx: The patient's family history includes Cancer in her sister; Diabetes in her brother and sister; Hypertension in her mother; Stroke in her mother. There is no history of Breast cancer.  ROS:   Please see the history of present illness.    She has not had any bleeding issues.  All other systems reviewed and are negative.   Prior CV studies:   The following studies were reviewed today:  Cardiac MRI 03/2012 Impression: 1)    No evidence of RV dysplasia Normal RV size and function 2)    Normal LV EF 74% no hyperenhancemet 3)    Mild LAE 4)    Mild MR   Event Monitor 01/2012 No significant arrhythmias   Myoview 01/2012 Normal nuclear images and EF.  However patient had significant arrhythmia with T wave changes in recovery.  ? RVOT VT Apparently ECG;s showed to Dr  Lovena Le and patient allowed to go home.  Discussed with Dr Aundra Dubin who will F/U with patient LV Ejection Fraction: 78%.  LV Wall Motion:  NL LV Function; NL Wall Motion   Echo 01/2012 EF 55-60%, normal wall motion, mild MR, mild LAE, PASP 37 mmHg   Labs/Other Tests and Data Reviewed:    EKG:  No ECG reviewed.  Recent Labs: 11/11/2017: BUN 15; Creatinine, Ser 1.03; Hemoglobin 12.6; Platelets 263; Potassium 3.8; Sodium 143   Recent Lipid Panel Lab Results  Component Value Date/Time   CHOL 175 07/15/2014 02:58 PM   TRIG 188.0 (H) 07/15/2014 02:58 PM   HDL 28.90 (L) 07/15/2014 02:58 PM   CHOLHDL 6 07/15/2014 02:58 PM   LDLCALC 109 (H) 07/15/2014 02:58  PM   From [facility]   Wt Readings from Last 3 Encounters:  11/05/18 181 lb (82.1 kg)  10/07/17 177 lb 1.9 oz (80.3 kg)  08/05/15 176 lb 12.8 oz (80.2 kg)     Objective:    Vital Signs:  BP (!) 125/54   Pulse 73   Ht 5\' 8"  (1.727 m)   Wt 181 lb (82.1 kg)   LMP 11/10/2012   BMI 27.52 kg/m    VITAL SIGNS:  reviewed GEN:  no acute distress EYES:  sclerae anicteric, EOMI - Extraocular Movements Intact RESPIRATORY:  normal respiratory effort, symmetric expansion NEURO:  alert and oriented x 3, no obvious focal deficit PSYCH:  normal affect  ASSESSMENT & PLAN:    PAF (paroxysmal atrial fibrillation) ([place]) She seems to be doing well.  By history, she seems to be maintaining normal sinus rhythm.  She did have one episode of palpitations.  She has occasional dizziness.  I have advised her to contact us if she continues to have dizziness or palpitations.  At that point, I would have her wear an event monitor to screen for recurrent atrial fibrillation.  Recent creatinine normal.  Continue current dose of rivaroxaban.  Recent hemoglobin normal.  VT (ventricular tachycardia) ([place]) This occurred during stress testing some years ago and was felt to be from the RVOT.  MRI was not suggestive of ARVC.  She has been managed with beta-blocker  and calcium channel blocker therapy.  As noted, if she has more dizziness or palpitations, I will set her up for an event monitor.  Essential hypertension Her blood pressure is controlled.  However, her diastolic reading is somewhat low and she seems to be symptomatic with this.  Therefore, I have discontinued her chlorthalidone and potassium.  She may take this as needed for lower extremity swelling.  I have asked her to monitor her blood pressure over the next several weeks.  If she notes that her blood pressure is above target, she will contact me.  Hyperlipidemia, unspecified hyperlipidemia type LDL optimal on most recent lab work.  Continue current Rx.    COVID-19 Education: The signs and symptoms of COVID-19 were discussed with the patient and how to seek care for testing (follow up with PCP or arrange E-visit).  The importance of social distancing was discussed today.  Time:   Today, I have spent 11 minutes with the patient with telehealth technology discussing the above problems.     Medication Adjustments/Labs and Tests Ordered: Current medicines are reviewed at length with the patient today.  Concerns regarding medicines are outlined above.   Tests Ordered: No orders of the defined types were placed in this encounter.   Medication Changes: No orders of the defined types were placed in this encounter.   Disposition:  Follow up in 6 month(s).  She previously saw Dr. Aundra Dubin and was to follow with me along with Dr. Saunders Revel.  I will continue to follow her along with Dr. Burt Knack given Dr. Darnelle Bos transition to [place].   Signed, Richardson Dopp, PA-C  11/05/2018 10:47 AM    [facility]

## 2018-11-05 ENCOUNTER — Other Ambulatory Visit: Payer: Self-pay

## 2018-11-05 ENCOUNTER — Telehealth (INDEPENDENT_AMBULATORY_CARE_PROVIDER_SITE_OTHER): Payer: BC Managed Care – PPO | Admitting: Physician Assistant

## 2018-11-05 VITALS — BP 125/54 | HR 73 | Ht 68.0 in | Wt 181.0 lb

## 2018-11-05 DIAGNOSIS — I480 Paroxysmal atrial fibrillation: Secondary | ICD-10-CM

## 2018-11-05 DIAGNOSIS — E785 Hyperlipidemia, unspecified: Secondary | ICD-10-CM

## 2018-11-05 DIAGNOSIS — I472 Ventricular tachycardia, unspecified: Secondary | ICD-10-CM

## 2018-11-05 DIAGNOSIS — I10 Essential (primary) hypertension: Secondary | ICD-10-CM

## 2018-11-05 DIAGNOSIS — Z7189 Other specified counseling: Secondary | ICD-10-CM

## 2018-11-05 NOTE — Patient Instructions (Signed)
Medication Instructions:  Stop taking Chlorthalidone Stop taking Potassium  If you have any issues with swelling in your legs after stopping the Chlorthalidone, you can take it once daily as needed for swelling.  If you take Chlorthalidone, make sure you take a potassium tablet with it.  If you need a refill on your cardiac medications before your next appointment, please call your pharmacy.   Lab work: None  If you have labs (blood work) drawn today and your tests are completely normal, you will receive your results only by: Marland Kitchen MyChart Message (if you have MyChart) OR . A paper copy in the mail If you have any lab test that is abnormal or we need to change your treatment, we will call you to review the results.  Testing/Procedures: None   Follow-Up: At [facility], you and your health needs are our priority.  As part of our continuing mission to provide you with exceptional heart care, we have created designated Provider Care Teams.  These Care Teams include your primary Cardiologist (physician) and Advanced Practice Providers (APPs -  Physician Assistants and Nurse Practitioners) who all work together to provide you with the care you need, when you need it. Richardson Dopp, PA-C in 6 months.  Any Other Special Instructions Will Be Listed Below (If Applicable).  Check your blood pressure often. If your blood pressure, on average, is greater than 130/80, let me know.  If you continue to have dizziness or if you have more palpitations, let me know.

## 2019-01-28 ENCOUNTER — Telehealth: Payer: Self-pay | Admitting: Physician Assistant

## 2019-01-28 MED ORDER — CHLORTHALIDONE 25 MG PO TABS: 25.0000 mg | ORAL_TABLET | ORAL | 3 refills | Status: DC | PRN

## 2019-01-28 MED ORDER — POTASSIUM CHLORIDE CRYS ER 20 MEQ PO TBCR: 20.0000 meq | EXTENDED_RELEASE_TABLET | ORAL | 3 refills | Status: DC | PRN

## 2019-01-28 NOTE — Telephone Encounter (Signed)
Ok to refill both and change directions to take once daily as needed for swelling, #30, 3 refills Richardson Dopp, PA-C    01/28/2019 2:04 PM

## 2019-01-28 NOTE — Telephone Encounter (Signed)
Pt calling requesting a refill on medication that was D/C at [facility] with Margaret Pyle. Chlorthalidone and Potassium, pt states that she takes as needed. Pt would like these medications sent to her pharmacy [facility] on [address]. Please address

## 2019-02-19 ENCOUNTER — Other Ambulatory Visit: Payer: Self-pay

## 2019-02-19 DIAGNOSIS — Z20822 Contact with and (suspected) exposure to covid-19: Secondary | ICD-10-CM

## 2019-02-21 LAB — NOVEL CORONAVIRUS, NAA: SARS-CoV-2, NAA: NOT DETECTED

## 2019-03-03 ENCOUNTER — Telehealth: Payer: Self-pay | Admitting: Physician Assistant

## 2019-03-03 NOTE — Telephone Encounter (Signed)
The patient states she had a talk with her daughter in law this morning and it really stressed her out. Shortly after, her HR went up to about 102 and BP to 180/58. She went to Urgent Care to be checked out. An EKG was done and she was told she was not in afib. They checked her urine and did start her on amoxicillin for "something they found in there." Her most recent BP was 134/64 and HR in the 60s. She is also much calmer. She thinks she may have had a small panic attack but isn't sure - she denies SOB. The patient is asymptomatic now and feels much better. Will request records from Urgent Care tomorrow and discuss with Nicki Reaper and call her back. She was grateful for assistance.   [facility] [phone]

## 2019-03-03 NOTE — Telephone Encounter (Signed)
° °  Patient calling to report she went to urgent care today to be seen for rapid HR.  EKG did not show afib BP 180/58 HR 102, most recent 143/67 HR 75

## 2019-03-04 NOTE — Telephone Encounter (Signed)
Attempted to call Urgent Care several times today to request records but could not get through. Will try again later.

## 2019-03-05 NOTE — Telephone Encounter (Signed)
  Patient is calling back to follow up on previous message. Patient states that she is having a panic attack now and she had one last night at 2 am.

## 2019-03-05 NOTE — Telephone Encounter (Signed)
Agree Richardson Dopp, PA-C    03/05/2019 5:01 PM

## 2019-03-05 NOTE — Telephone Encounter (Signed)
Spoke with the patient. Reiterated to her that multiple attempts have been made to retrieve records from Urgent Care but Nursing could never get through. Alison Porter states she has a history of panic attacks and is being treated by Dr. Alroy Dust. He prescribed her Xanax and she has found herself having to take more and more due to more recurrent episodes.  She states her heart races and she feels like her BP is increased during the episodes, but she has no readings. She does get SOB but no CP. She feels panicky and scared during the attacks. She states the more frequent, recent episodes feel just like her other panic attacks, but the Xanax isn't relieving anymore. She is currently asymptomatic.  Instructed her to call Dr. Alroy Dust ASAP and explain symptoms. She understands if Dr. Alroy Dust thinks her symptoms are cardiac, an evaluation will be scheduled. She was grateful for assistance.

## 2019-03-06 ENCOUNTER — Telehealth: Payer: Self-pay | Admitting: Physician Assistant

## 2019-03-06 ENCOUNTER — Telehealth: Payer: Self-pay | Admitting: Radiology

## 2019-03-06 ENCOUNTER — Other Ambulatory Visit: Payer: Self-pay

## 2019-03-06 DIAGNOSIS — I472 Ventricular tachycardia, unspecified: Secondary | ICD-10-CM

## 2019-03-06 NOTE — Telephone Encounter (Signed)
Patient wanted Alison Porter and his nurse to know that she spoke with her PCP and he said that the symptoms may not have been anxiety but were in fact cardiac related. I set the patient up with an in-person visit for 09/29. Pt will see how she feels over the weekend and if necessary make an appointment with another PA sooner

## 2019-03-06 NOTE — Telephone Encounter (Signed)
New Message   Patient is calling to advise that she did speak with urgent care and they will fax over the EKG to be reviewed.

## 2019-03-06 NOTE — Telephone Encounter (Signed)
Enrolled patient for a 30 day [employer] Event monitor to be mailed. Brief instructions were gone over with the patient and she knows to expect the monitor to arrive in 3-4 days.  

## 2019-03-06 NOTE — Telephone Encounter (Signed)
Monitor mailed

## 2019-03-06 NOTE — Telephone Encounter (Signed)
Please arrange 30 day event monitor. If patient has recurrent/worsening symptoms call for earlier appointment (add on to DOD or APPOD if needed).  If more severe symptoms, go to the ED. Richardson Dopp, PA-C    03/06/2019 9:14 AM

## 2019-03-06 NOTE — Telephone Encounter (Signed)
Patient had urgent care fax over her EKG from Monday. She is not symptomatic at this time. Agreeable to call back if symptoms persist. Advised pt that we could potentially get in a sooner appt if necessary. Advised pt of [facility] [place] recommendation for 30 day event monitor.

## 2019-03-06 NOTE — Telephone Encounter (Signed)
[address] [place], [place]    03/06/2019 12:54 PM

## 2019-03-11 ENCOUNTER — Ambulatory Visit (INDEPENDENT_AMBULATORY_CARE_PROVIDER_SITE_OTHER): Payer: BC Managed Care – PPO

## 2019-03-11 DIAGNOSIS — I472 Ventricular tachycardia, unspecified: Secondary | ICD-10-CM

## 2019-03-13 ENCOUNTER — Telehealth: Payer: Self-pay | Admitting: *Deleted

## 2019-03-13 NOTE — Telephone Encounter (Signed)
Reviewed ECG from urgent care. There are no changes noted when compared to prior ECGs. Continue current medications and follow up as planned.  Richardson Dopp, PA-C    03/13/2019 10:14 AM

## 2019-03-13 NOTE — Telephone Encounter (Signed)
-----   Message from Liliane Shi, [place] sent at 03/13/2019 10:14 AM EDT -----   ----- Message ----- From: Wilma Flavin, RN Sent: 03/06/2019   9:50 AM EDT To: Liliane Shi, PA-C  FYI

## 2019-03-13 NOTE — Telephone Encounter (Signed)
S/w pt is aware of Scotts recommendation's.

## 2019-03-23 ENCOUNTER — Other Ambulatory Visit: Payer: Self-pay | Admitting: Family Medicine

## 2019-03-23 DIAGNOSIS — Z1231 Encounter for screening mammogram for malignant neoplasm of breast: Secondary | ICD-10-CM

## 2019-04-07 ENCOUNTER — Ambulatory Visit: Payer: BC Managed Care – PPO | Admitting: Physician Assistant

## 2019-04-29 ENCOUNTER — Telehealth: Payer: Self-pay | Admitting: Physician Assistant

## 2019-04-29 NOTE — Telephone Encounter (Signed)
°*  STAT* If patient is at the pharmacy, call can be transferred to refill team.   1. Which medications need to be refilled? (please list name of each medication and dose if known)  Xarelto  2. Which pharmacy/location (including street and city if local pharmacy) is medication to be sent to? [place], [place]  3. Do they need a 30 day or 90 day supply? 90 and refills

## 2019-05-01 ENCOUNTER — Other Ambulatory Visit: Payer: Self-pay

## 2019-05-01 ENCOUNTER — Ambulatory Visit
Admission: RE | Admit: 2019-05-01 | Discharge: 2019-05-01 | Disposition: A | Discharge status: Home / Self Care | Payer: BC Managed Care – PPO | Source: Ambulatory Visit | Attending: Family Medicine | Admitting: Family Medicine

## 2019-05-01 DIAGNOSIS — Z1231 Encounter for screening mammogram for malignant neoplasm of breast: Secondary | ICD-10-CM

## 2019-05-05 ENCOUNTER — Other Ambulatory Visit: Payer: Self-pay

## 2019-05-05 ENCOUNTER — Ambulatory Visit: Payer: BC Managed Care – PPO | Admitting: Physician Assistant

## 2019-05-05 ENCOUNTER — Encounter: Payer: Self-pay | Admitting: Physician Assistant

## 2019-05-05 ENCOUNTER — Other Ambulatory Visit: Payer: Self-pay | Admitting: Family Medicine

## 2019-05-05 VITALS — BP 160/70 | HR 69 | Ht 68.0 in | Wt 185.1 lb

## 2019-05-05 DIAGNOSIS — I10 Essential (primary) hypertension: Secondary | ICD-10-CM

## 2019-05-05 DIAGNOSIS — R002 Palpitations: Secondary | ICD-10-CM | POA: Diagnosis not present

## 2019-05-05 DIAGNOSIS — I480 Paroxysmal atrial fibrillation: Secondary | ICD-10-CM | POA: Diagnosis not present

## 2019-05-05 DIAGNOSIS — I472 Ventricular tachycardia, unspecified: Secondary | ICD-10-CM

## 2019-05-05 DIAGNOSIS — R928 Other abnormal and inconclusive findings on diagnostic imaging of breast: Secondary | ICD-10-CM

## 2019-05-05 NOTE — Patient Instructions (Addendum)
Medication Instructions:  Your physician recommends that you continue on your current medications as directed. Please refer to the Current Medication list given to you today.  *If you need a refill on your cardiac medications before your next appointment, please call your pharmacy*  Lab Work: Your physician recommends that you return for lab work in: 2-3 weeks (BMET). Make appointment same day as Hypertension Clinic.   If you have labs (blood work) drawn today and your tests are completely normal, you will receive your results only by: Marland Kitchen MyChart Message (if you have MyChart) OR . A paper copy in the mail If you have any lab test that is abnormal or we need to change your treatment, we will call you to review the results.  Testing/Procedures: None  Follow-Up:  Your physician recommends that you schedule a follow-up appointment in: 2-3 weeks to see our Pharmacist in the Hypertension Clinic. Please be sure to bring your blood pressure monitor with you to this appointment.   At [facility], you and your health needs are our priority.  As part of our continuing mission to provide you with exceptional heart care, we have created designated Provider Care Teams.  These Care Teams include your primary Cardiologist (physician) and Advanced Practice Providers (APPs -  Physician Assistants and Nurse Practitioners) who all work together to provide you with the care you need, when you need it.  Your next appointment:   6 months  The format for your next appointment:   Either In Person or Virtual  Provider:   You may see Sherren Mocha, MD or one of the following Advanced Practice Providers on your designated Care Team:    Richardson Dopp, PA-C  [place], [place]  Daune Perch, NP   Other Instructions

## 2019-05-05 NOTE — Progress Notes (Signed)
Cardiology Office Note:    Date:  05/05/2019   ID:  Yujin Antes, DOB 07/17/1955, MRN [id number]  PCP:  Alroy Dust, Carlean Jews.[name], MD  Cardiologist:  Sherren Mocha, MD / Richardson Dopp, PA-C  Electrophysiologist:  None   Referring MD: Aurea Graff.[name], MD   Chief Complaint  Patient presents with  . Palpitations    History of Present Illness:    Brinsley Mateja is a 63 y.o. female with:   Atrial fibrillation: Paroxysmal.Admitted 6/13 with afib/RVR, back to NSR on diltiazem.Echo (7/13): EF 55-60%, mild MR, normal RV.   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  Sleep apnea  Diabetes mellitus   Hypertension   Hypothyroidism  Hyperlipidemia   Ms. Kingsford was last seen via telemedicine in April 2020.  She called in with episodes of palpitations in August.  An event monitor was arranged.  This demonstrated sinus rhythm, <1% burden PVCs and PACs and one 11 beat run of SVT.  There were no sustained arrhythmias and no evidence of ventricular tachycardia.  She returns for follow-up.  She is here alone.  She notes significant emotional stress around the time that she called in with palpitations.  The situation has improved and she has not had further palpitations.  She has not had chest pain, shortness of breath, syncope, orthopnea.  She has noticed increasing lower extremity swelling recently.  She started back on chlorthalidone plus potassium.  Her blood pressures at home sometimes run low in the evenings.  At other times, her blood pressure may be 130s-140s.   Prior CV studies:   The following studies were reviewed today:  Event monitor 04/2019 1) The basic rhythm is normal sinus with an  average HR of 66 bpm 2) There are occasional PVC's and PAC's (<1% burden) 3) There is one 11 beat run of SVT, but no sustained arrhythmia 4. There are no bradycardic events or sustained arrhythmias  Cardiac MRI 03/2012 Impression: 1)No evidence of RV dysplasia Normal RV size and function 2)Normal LV EF 74% no hyperenhancemet 3)Mild LAE 4)Mild MR  Event Monitor 01/2012 No significant arrhythmias  Myoview 01/2012 Normal nuclear images and EF.However patient had significant arrhythmia with T wave changes in recovery.? RVOT VT Apparently ECG;s showed to Dr Lovena Le and patient allowed to go home.Discussed with Dr Aundra Dubin who will F/U with patient LV Ejection Fraction: 78%.LV Wall Motion:NL LV Function; NL Wall Motion  Echo 01/2012 EF 55-60%, normal wall motion, mild MR, mild LAE, PASP 37 mmHg  Past Medical History:  Diagnosis Date  . Anemia   . Anxiety   . Anxiety   . Atrial fibrillation ([place])   . Heart murmur   . Hypokalemia   . Other and unspecified hyperlipidemia   . Type II or unspecified type diabetes mellitus without mention of complication, not stated as uncontrolled   . Unspecified essential hypertension   . Unspecified hypothyroidism    Surgical Hx: The patient  has a past surgical history that includes Eye surgery (Left).   Current Medications: Current Meds  Medication Sig  . ALPRAZolam (XANAX) 0.25 MG tablet Take 0.25 mg by mouth 3 (three) times daily as needed for anxiety.   . chlorthalidone (HYGROTON) 25 MG tablet Take 1 tablet (25 mg total) by  mouth as needed (as needed for swelling).  Marland Kitchen diltiazem (CARDIZEM CD) 360 MG 24 hr capsule TAKE ONE CAPSULE BY MOUTH ONCE DAILY  . ferrous sulfate 325 (65 FE) MG tablet Take 325 mg by mouth 2 (two) times daily.  . hydrALAZINE (APRESOLINE) 100 MG tablet Take 100 mg by mouth 2 (two) times daily.  Marland Kitchen levothyroxine (SYNTHROID, LEVOTHROID) 50 MCG tablet Take 50 mcg by mouth daily.  . metFORMIN  (GLUCOPHAGE) 500 MG tablet Take 500 mg by mouth 2 (two) times daily with a meal.   . metoprolol (TOPROL-XL) 200 MG 24 hr tablet Take 200 mg by mouth daily.  Marland Kitchen NOVOLOG MIX 70/30 FLEXPEN (70-30) 100 UNIT/ML Pen Patient inject  33 units in the morning and 25 units in the evening at bedtime  . omega-3 acid ethyl esters (LOVAZA) 1 G capsule Take 1 g by mouth daily.  Glory Rosebush VERIO test strip 1 each by Other route as needed (testing blood sugar).   . pantoprazole (PROTONIX) 40 MG tablet Take 40 mg by mouth daily as needed (for stomach upset).  . potassium chloride SA (K-DUR) 20 MEQ tablet Take 1 tablet (20 mEq total) by mouth as needed (when you take Chlorthalidone).  . pravastatin (PRAVACHOL) 40 MG tablet Take 40 mg by mouth daily.   Alveda Reasons 20 MG TABS tablet TAKE ONE TABLET BY MOUTH ONCE DAILY     Allergies:   Codeine phosphate   Social History   Tobacco Use  . Smoking status: Former Smoker    Types: Cigarettes    Quit date: 07/10/1983    Years since quitting: 35.8  . Smokeless tobacco: Never Used  Substance Use Topics  . Alcohol use: No  . Drug use: No     Family Hx: The patient's family history includes Cancer in her sister; Diabetes in her brother and sister; Hypertension in her mother; Stroke in her mother. There is no history of Breast cancer.  ROS:   Please see the history of present illness.    ROS All other systems reviewed and are negative.   EKGs/Labs/Other Test Reviewed:    EKG:  EKG is ordered today.  The ekg ordered today demonstrates normal sinus rhythm, heart rate 69, left axis deviation, nonspecific ST-T wave changes, QTC 462, increased artifact, no change from prior tracing  Recent Labs: No results found for requested labs within last 8760 hours.   Recent Lipid Panel Lab Results  Component Value Date/Time   CHOL 175 07/15/2014 02:58 PM   TRIG 188.0 (H) 07/15/2014 02:58 PM   HDL 28.90 (L) 07/15/2014 02:58 PM   CHOLHDL 6 07/15/2014 02:58 PM   LDLCALC 109  (H) 07/15/2014 02:58 PM     Physical Exam:    VS:  BP (!) 160/70   Pulse 69   Ht 5\' 8"  (1.727 m)   Wt 185 lb 1.9 oz (84 kg)   LMP 11/10/2012   BMI 28.15 kg/m     Wt Readings from Last 3 Encounters:  05/05/19 185 lb 1.9 oz (84 kg)  11/05/18 181 lb (82.1 kg)  10/07/17 177 lb 1.9 oz (80.3 kg)     Physical Exam  Constitutional: She is oriented to person, place, and time. She appears well-developed and well-nourished. No distress.  HENT:  Head: Normocephalic and atraumatic.  Eyes: No scleral icterus.  Neck: Neck supple. No JVD present. No thyromegaly present.  Cardiovascular: Normal rate, regular rhythm, S1 normal and S2 normal.  Murmur heard.  Early systolic murmur is present  with a grade of 2/6 at the upper right sternal border. Pulmonary/Chest: Effort normal and breath sounds normal. She has no rales.  Abdominal: Soft. There is no hepatomegaly.  Musculoskeletal:        General: Edema (trace bilat ankle edema) present.  Lymphadenopathy:    She has no cervical adenopathy.  Neurological: She is alert and oriented to person, place, and time.  Skin: Skin is warm and dry.  Psychiatric: She has a normal mood and affect.    ASSESSMENT & PLAN:    1. PAF (paroxysmal atrial fibrillation) (HCC) Maintaining normal sinus rhythm.  Continue current dose of metoprolol, diltiazem.  Continue Rivaroxaban.  2. VT (ventricular tachycardia) (HCC) History of RVOT ventricular tachycardia.  Cardiac MRI in the past was not consistent with ARVC.  Continue beta-blocker, calcium channel blocker.  Recent event monitor without recurrent ventricular tachycardia.  3. Palpitations As noted, she had a recent event monitor to evaluate palpitations.  This demonstrated no significant arrhythmias.  She did have 1 episode of SVT for 11 beats.  She did not have any symptoms while she was wearing the monitor.  The symptoms seem to be mainly related to significant emotional stressors.  These have since resolved.     4. Essential hypertension Blood pressure above target today.  She notes better blood pressure readings at home.  She sometimes has lower blood pressure readings at home.  She has recently started back on chlorthalidone due to to lower extremity swelling.  Continue current therapy.  Arrange follow-up BMET in 2 to 3 weeks.  I will also arrange a hypertension clinic visit in 2 to 3 weeks to check her blood pressure machine from home.  Dispo:  Return in about 6 months (around 11/03/2019) for Routine Follow Up, w/ Richardson Dopp, PA-C, (virtual or in-person).   Medication Adjustments/Labs and Tests Ordered: Current medicines are reviewed at length with the patient today.  Concerns regarding medicines are outlined above.  Tests Ordered: Orders Placed This Encounter  Procedures  . Basic metabolic panel  . EKG 12-Lead   Medication Changes: No orders of the defined types were placed in this encounter.   Signed, Richardson Dopp, PA-C  05/05/2019 3:27 PM    [facility] [address], [place], [place]  [address] Phone: [phone]; Fax: [phone]

## 2019-05-06 ENCOUNTER — Other Ambulatory Visit: Payer: Self-pay | Admitting: Obstetrics & Gynecology

## 2019-05-08 ENCOUNTER — Ambulatory Visit
Admission: RE | Admit: 2019-05-08 | Discharge: 2019-05-08 | Disposition: A | Discharge status: Home / Self Care | Payer: BC Managed Care – PPO | Source: Ambulatory Visit | Attending: Family Medicine | Admitting: Family Medicine

## 2019-05-08 ENCOUNTER — Other Ambulatory Visit: Payer: Self-pay

## 2019-05-08 ENCOUNTER — Other Ambulatory Visit: Payer: Self-pay | Admitting: Family Medicine

## 2019-05-08 DIAGNOSIS — R928 Other abnormal and inconclusive findings on diagnostic imaging of breast: Secondary | ICD-10-CM

## 2019-05-08 DIAGNOSIS — N6320 Unspecified lump in the left breast, unspecified quadrant: Secondary | ICD-10-CM

## 2019-05-11 NOTE — Telephone Encounter (Signed)
S/w pt is aware of Scott's recommendation's.

## 2019-05-16 NOTE — Patient Instructions (Addendum)
It was a pleasure seeing you in clinic today [name]!  Today the plan is...  Continue diltiazem 360 mg daily, hydralazine 100 mg twice a day, metoprolol XL 200 mg daily, and chlorthalidone 12.5 mg as needed for swelling.  Continue to check your blood pressure at home daily. Please send your results to me via MyChart in 2 weeks.  Increase your exercise to 30 min 5 days a week and try to add in 2 days a week of resistance training  Please call the PharmD clinic at [phone] if you have any questions that you would like to speak with a pharmacist about [name], [name]).

## 2019-05-16 NOTE — Progress Notes (Signed)
Patient ID: Alison Porter                 DOB: 04-16-1956                      MRN: [id number]     HPI: Alison Porter is a 63 y.o. female patient of Dr. Antionette Char referred by Alison Porter to HTN clinic. PMH is significant for HTN, Afib, VT, DM, hypothyroidism, and HLD. At last appt with Alison Porter (05/05/2019) pt's blood pressure was elevated, however stated that BP readings were lower at home. She was recently started back on chlorthalidone and potassium due to to lower extremity swelling.  Patient presents today to clinic in good spirit. She denies chest pain, shortness of breath, dizziness or syncope. She has had a little swelling. She took chlorthalidone couple of days last week but none this week. Her blood pressure at home is well controlled. Patient states the doctors office makes her nervous. She did bring her home cuff with her to calibrate. Compared to clinic cuff, systolic measurement is accurate, diastolic measurement is a little higher (about 10 points) than in clinic.   Current HTN meds: chlorthalidone 12.5 mg daily prn for swelling, diltiazem 360 mg daily, hydralazine 100 mg BID, metoprolol XL 200 mg daily, and potassium 20 mEq Previously tried: lisinopril (cough), lisinopril-HCTZ (hypokalemia), losartan (recall), olmesartan-amlodipine-HCTZ, candesartan BP goal: <130/80 mmHg  Family History: sister (cancer, DM); brother (DM); mother (HTN, stroke)  Social History: no tobacco no etoh   Diet: breakfast- egg (little salt), bacon, bread- juice Lunch- baked chicken, string beans, corn bread (does cook w/ salt) Dinner- burger, fries Mostly eats at home Drink- water, orange juice, hawaiian punch  Exercise: walks for about 30 min 5 days a week (but hasnt in a few weeks)  Home BP readings:  10/30 8:25pm 116/62 pulse 65 10/31 4:25pm 121/60    56 11/1 7:00pm 117/60  65 11/2 8:40pm 114/55  60 11/3 12:35pm 130/66  70 11/3 8:25pm 115/56 62 11/4 11:00pm 132/68  59 11/5  9:30am 131/74 62   Wt Readings from Last 3 Encounters:  05/05/19 185 lb 1.9 oz (84 kg)  11/05/18 181 lb (82.1 kg)  10/07/17 177 lb 1.9 oz (80.3 kg)   BP Readings from Last 3 Encounters:  05/05/19 (!) 160/70  11/05/18 (!) 125/54  08/07/18 (!) 180/82   Pulse Readings from Last 3 Encounters:  05/05/19 69  11/05/18 73  08/07/18 69    Renal function: CrCl cannot be calculated (Patient's most recent lab result is older than the maximum 21 days allowed.).  Past Medical History:  Diagnosis Date  . Anemia   . Anxiety   . Anxiety   . Atrial fibrillation ([place])   . Heart murmur   . Hypokalemia   . Other and unspecified hyperlipidemia   . Type II or unspecified type diabetes mellitus without mention of complication, not stated as uncontrolled   . Unspecified essential hypertension   . Unspecified hypothyroidism     Current Outpatient Medications on File Prior to Visit  Medication Sig Dispense Refill  . ALPRAZolam (XANAX) 0.25 MG tablet Take 0.25 mg by mouth 3 (three) times daily as needed for anxiety.     . chlorthalidone (HYGROTON) 25 MG tablet Take 1 tablet (25 mg total) by mouth as needed (as needed for swelling). 30 tablet 3  . diltiazem (CARDIZEM CD) 360 MG 24 hr capsule TAKE ONE CAPSULE BY MOUTH ONCE DAILY 30 capsule 4  .  ferrous sulfate 325 (65 FE) MG tablet Take 325 mg by mouth 2 (two) times daily.    . hydrALAZINE (APRESOLINE) 100 MG tablet Take 100 mg by mouth 2 (two) times daily.    Marland Kitchen levothyroxine (SYNTHROID, LEVOTHROID) 50 MCG tablet Take 50 mcg by mouth daily.    . metFORMIN (GLUCOPHAGE) 500 MG tablet Take 500 mg by mouth 2 (two) times daily with a meal.     . metoprolol (TOPROL-XL) 200 MG 24 hr tablet Take 200 mg by mouth daily.    Marland Kitchen NOVOLOG MIX 70/30 FLEXPEN (70-30) 100 UNIT/ML Pen Patient inject  33 units in the morning and 25 units in the evening at bedtime    . omega-3 acid ethyl esters (LOVAZA) 1 G capsule Take 1 g by mouth daily.    Glory Rosebush VERIO test strip  1 each by Other route as needed (testing blood sugar).     . pantoprazole (PROTONIX) 40 MG tablet Take 40 mg by mouth daily as needed (for stomach upset).    . potassium chloride SA (K-DUR) 20 MEQ tablet Take 1 tablet (20 mEq total) by mouth as needed (when you take Chlorthalidone). 30 tablet 3  . pravastatin (PRAVACHOL) 40 MG tablet Take 40 mg by mouth daily.     Alveda Reasons 20 MG TABS tablet TAKE ONE TABLET BY MOUTH ONCE DAILY 30 tablet 11   No current facility-administered medications on file prior to visit.     Allergies  Allergen Reactions  . Codeine Phosphate Nausea Only     Assessment/Plan:  1. Hypertension - Blood pressure in clinic is above goal of <130/80. However, home readings have all been within goal or very close to goal. It appears as though patient has white coat hypertension. Her blood pressure meter is accurate (diastolic runs high). Patient called me when she got home from the visit and took her blood pressure and it was 125/81. I have asked the patient to continue to check her blood pressure at home and to send results via Mychart to me in 2 weeks. We discussed limiting salt by not salting food at the table or while cooking. Also discussed increasing exercise and adding resistance training twice a week. Follow up in clinic as needed.   Thank you,  Ramond Dial, Pharm.D, [place]  [id number], [place], [place] [address]  Phone: [phone]; Fax: [phone]

## 2019-05-19 ENCOUNTER — Other Ambulatory Visit: Payer: Self-pay

## 2019-05-19 ENCOUNTER — Encounter: Payer: Self-pay | Admitting: Pharmacist

## 2019-05-19 ENCOUNTER — Ambulatory Visit (INDEPENDENT_AMBULATORY_CARE_PROVIDER_SITE_OTHER): Payer: BC Managed Care – PPO | Admitting: Pharmacist

## 2019-05-19 ENCOUNTER — Other Ambulatory Visit: Payer: BC Managed Care – PPO

## 2019-05-19 VITALS — BP 165/60 | HR 64

## 2019-05-19 DIAGNOSIS — I10 Essential (primary) hypertension: Secondary | ICD-10-CM

## 2019-05-20 LAB — BASIC METABOLIC PANEL
BUN/Creatinine Ratio: 14 (ref 12–28)
BUN: 16 mg/dL (ref 8–27)
CO2: 23 mmol/L (ref 20–29)
Calcium: 10 mg/dL (ref 8.7–10.3)
Chloride: 104 mmol/L (ref 96–106)
Creatinine, Ser: 1.13 mg/dL — ABNORMAL HIGH (ref 0.57–1.00)
GFR calc Af Amer: 60 mL/min/{1.73_m2} (ref >59)
GFR calc non Af Amer: 52 mL/min/{1.73_m2} — ABNORMAL LOW (ref >59)
Glucose: 214 mg/dL — ABNORMAL HIGH (ref 65–99)
Potassium: 4.3 mmol/L (ref 3.5–5.2)
Sodium: 142 mmol/L (ref 134–144)

## 2019-06-02 NOTE — Telephone Encounter (Signed)
Attempted to call the pt to endorse recommendations per Richardson Dopp PA-C, in regards to BP readings sent via mychart to him.  Pts phone kept ringing with no VM set-up.  Triage to continue to follow-up with the pt, to endorse medication and lab recommendations, per Richardson Dopp PA-C.

## 2019-06-02 NOTE — Telephone Encounter (Signed)
BP somewhat borderline. I think she started back on Chlorthalidone 12.5 mg once daily several weeks back. Let's increase Chlorthalidone to 25 mg once daily  Take potassium once daily along with Chlorthalidone. BMET 1-2 weeks. Richardson Dopp, PA-C    06/02/2019 4:04 PM

## 2019-06-03 ENCOUNTER — Telehealth: Payer: Self-pay | Admitting: *Deleted

## 2019-06-03 DIAGNOSIS — Z79899 Other long term (current) drug therapy: Secondary | ICD-10-CM

## 2019-06-03 DIAGNOSIS — I10 Essential (primary) hypertension: Secondary | ICD-10-CM

## 2019-06-03 MED ORDER — POTASSIUM CHLORIDE CRYS ER 20 MEQ PO TBCR: 20.0000 meq | EXTENDED_RELEASE_TABLET | Freq: Two times a day (BID) | ORAL | 3 refills | Status: DC

## 2019-06-03 MED ORDER — CHLORTHALIDONE 25 MG PO TABS: 25.0000 mg | ORAL_TABLET | Freq: Every day | ORAL | 3 refills | Status: DC

## 2019-06-03 NOTE — Telephone Encounter (Signed)
Take K+ twice daily  BMET 1-2 weeks. Richardson Dopp, PA-C    06/03/2019 5:22 PM

## 2019-06-03 NOTE — Telephone Encounter (Signed)
Take K+ twice daily  BMET 1-2 weeks. Richardson Dopp, PA-C    06/03/2019 5:22 PM        Documentation     You  Richardson Dopp T, PA-C 36 minutes ago (4:47 PM)   Reviewed Alison Porter's instructions with pt to increase Chlorthalidone to 25 mg daily, take potassium once daily and repeat lab wotk in 1 to 2 weeks. Pt states understanding however reports in the past when she was on 25 mg daily she took Potassium twice a day. She is asking for clarification whether to take it once or twice. Aware I will clarify and call back.    Pt is aware to increase Potassium to twice a day.   Refills for both potassium and chloralidone sent into pharmacy. Lab ordered and scheduled for 06/18/2019.

## 2019-06-03 NOTE — Telephone Encounter (Signed)
Left message on patients voicemail to call office.   

## 2019-06-18 ENCOUNTER — Other Ambulatory Visit: Payer: BC Managed Care – PPO

## 2019-06-23 ENCOUNTER — Other Ambulatory Visit: Payer: BC Managed Care – PPO

## 2019-06-23 ENCOUNTER — Other Ambulatory Visit: Payer: Self-pay

## 2019-06-23 NOTE — Telephone Encounter (Signed)
See notes. Cancel BMET that was to be done today. Richardson Dopp, PA-C    06/23/2019 1:22 PM

## 2019-09-15 ENCOUNTER — Encounter: Payer: Self-pay | Admitting: Nurse Practitioner

## 2019-09-18 ENCOUNTER — Ambulatory Visit: Payer: BC Managed Care – PPO | Admitting: Nurse Practitioner

## 2019-09-22 ENCOUNTER — Other Ambulatory Visit: Payer: Self-pay | Admitting: Physician Assistant

## 2019-09-22 MED ORDER — CHLORTHALIDONE 25 MG PO TABS: 25.0000 mg | ORAL_TABLET | Freq: Every day | ORAL | 6 refills | Status: DC

## 2019-09-22 NOTE — Telephone Encounter (Signed)
Pt's medication was sent to pt's pharmacy as requested. Confirmation received.  °

## 2019-09-22 NOTE — Telephone Encounter (Signed)
*  STAT* If patient is at the pharmacy, call can be transferred to refill team.   1. Which medications need to be refilled? (please list name of each medication and dose if known) Chlorthalidone  2. Which pharmacy/location (including street and city if local pharmacy) is medication to be sent to? [facility]- [address], [place]  3. Do they need a 30 day or 90 day supply? 30 days and refills

## 2019-09-29 NOTE — Telephone Encounter (Signed)
Please call the patient. She can decrease Chlorthalidone to 12.5 mg once daily and reduced K+ to 20 mEq once daily. If systolic BP is less than [id number] and he is feeling dizzy, she can hold her medications for BP, push fluids and keep an eye on BP. Keep log of BPs and bring to next appt. Richardson Dopp, PA-C    09/29/2019 10:36 AM

## 2019-10-22 ENCOUNTER — Encounter: Payer: Self-pay | Admitting: General Surgery

## 2019-10-26 ENCOUNTER — Other Ambulatory Visit (INDEPENDENT_AMBULATORY_CARE_PROVIDER_SITE_OTHER): Payer: BC Managed Care – PPO

## 2019-10-26 ENCOUNTER — Ambulatory Visit: Payer: BC Managed Care – PPO | Admitting: Nurse Practitioner

## 2019-10-26 ENCOUNTER — Encounter: Payer: Self-pay | Admitting: Nurse Practitioner

## 2019-10-26 VITALS — BP 144/64 | HR 62 | Ht 68.0 in | Wt 184.0 lb

## 2019-10-26 DIAGNOSIS — D509 Iron deficiency anemia, unspecified: Secondary | ICD-10-CM | POA: Diagnosis not present

## 2019-10-26 DIAGNOSIS — K644 Residual hemorrhoidal skin tags: Secondary | ICD-10-CM

## 2019-10-26 DIAGNOSIS — Z86010 Personal history of colonic polyps: Secondary | ICD-10-CM | POA: Diagnosis not present

## 2019-10-26 DIAGNOSIS — K625 Hemorrhage of anus and rectum: Secondary | ICD-10-CM

## 2019-10-26 LAB — CBC
HCT: 43.6 % (ref 36.0–46.0)
Hemoglobin: 14.3 g/dL (ref 12.0–15.0)
MCHC: 32.7 g/dL (ref 30.0–36.0)
MCV: 84.5 fl (ref 78.0–100.0)
Platelets: 264 10*3/uL (ref 150.0–400.0)
RBC: 5.16 Mil/uL — ABNORMAL HIGH (ref 3.87–5.11)
RDW: 14.4 % (ref 11.5–15.5)
WBC: 5.4 10*3/uL (ref 4.0–10.5)

## 2019-10-26 NOTE — Patient Instructions (Signed)
If you are age 63 or older, your body mass index should be between 23-30. Your Body mass index is 27.98 kg/m. If this is out of the aforementioned range listed, please consider follow up with your Primary Care Provider.  If you are age 35 or younger, your body mass index should be between 19-25. Your Body mass index is 27.98 kg/m. If this is out of the aformentioned range listed, please consider follow up with your Primary Care Provider.    Please use the following Over the counter medications:  Desitin -apply a small amount inside the anal opening and the the external hemorrhoids three times daily as needed.  Miralax- for constipation as needed.  Your provider has requested that you go to the basement level for lab work before leaving today. Press "B" on the elevator. The lab is located at the first door on the left as you exit the elevator.  It has been recommended to you by your physician that you have a(n) Colonoscopy completed. Per your request, we did not schedule the procedure(s) today. Please contact our office at [phone] should you decide to have the procedure completed. When you are able to schedule you will need to holld Ferrous Sulfate for 1 week prior to your colonoscopy.  Due to recent changes in healthcare laws, you may see the results of your imaging and laboratory studies on MyChart before your provider has had a chance to review them.  We understand that in some cases there may be results that are confusing or concerning to you. Not all laboratory results come back in the same time frame and the provider may be waiting for multiple results in order to interpret others.  Please give [place] 48 hours in order for your provider to thoroughly review all the results before contacting the office for clarification of your results.   Thank you for choosing [facility] Noralyn Pick, CRNP

## 2019-10-26 NOTE — Progress Notes (Signed)
10/26/2019 [place] [id number] Jul 01, 1956   CHIEF COMPLAINT:  Rectal bleeding   HISTORY OF PRESENT ILLNESS:  Alison Porter is a 64 year old female with a past medical history of anxiety, hypertension, atrial fibrillation on Xarelto, ventricular tachycardia in 2013, SVT, DM II, hypothyroidism and chronic IDA on Ferrous Sulfate 325mg  daily. She presents today with complaints of rectal bleeding which she attributes to having hemorrhoids. She reports seeing bright red blood on the stool, sometimes in the toilet water and on the toilet tissue for 2 to 3 days every 3 to 4 months. No associated anal/rectal pain or itchiness. She uses Proctocream PRN and sometimes uses Preparation H. She sometimes strains to pass a normal brown bowel movement. No melena. She reports having a history of IDA for the past 42 years which she associated to hemorrhoidal bleeding which worsened after her 4th pregnancy 42 years ago. She denies having a past history of menorrhalgia. No post menopausal uterine bleeding. She's never seen a hematologist. She infrequently has heartburn if she eats citrus or tomato products. No dysphagia or stomach pain. She has a history of colon polyps. Her most recent colonoscopy was 10/07/2012 which identified a 20 mm flat polyp at the cecum (biopsies showed polypoid rectal mucosa, no evidence of malignancy), 2 sessile tubular adenomatous polyps measuring 4 to 6 mm in the descending colon and a 4 mm sessile hyperplastic polyp to the rectum with sigmoid and small external hemorrhoids.  Diverticulum at the IC valve was noted.  Normal mucosa in the terminal ileum. No family history of colon cancer. She has a history of atrial fibrillation on Xarelto followed by cardiologist Dr. Sherren Mocha and Richardson Dopp PA-C. No history of CAD. No chest pain or palpitations.   Event monitor 04/2019 1) The basic rhythm is normal sinus with an average HR of 66 bpm 2) There are occasional PVC's and PAC's  (<1% burden) 3) There is one 11 beat run of SVT, but no sustained arrhythmia 4. There are no bradycardic events or sustained arrhythmias  ECHO 01/16/2012: LV EF: 55% -  60%     Past Medical History:  Diagnosis Date  . Anemia   . Anxiety   . Anxiety   . Atrial fibrillation ([place])   . Heart murmur   . Hypokalemia   . Other and unspecified hyperlipidemia   . Type II or unspecified type diabetes mellitus without mention of complication, not stated as uncontrolled   . Unspecified essential hypertension   . Unspecified hypothyroidism    Past Surgical History:  Procedure Laterality Date  . EYE SURGERY Left    Social History: She is separated. She has 3 sons and 1 daughter. She is retired.   reports that she has been smoking. She has smoked for the past 15.00 years. She has never used smokeless tobacco. She reports that she does not drink alcohol or use drugs. family history includes Cancer in her sister; Diabetes in her brother and sister; Hypertension in her mother; Stroke in her mother. Allergies  Allergen Reactions  . Codeine Phosphate Nausea Only      Outpatient Encounter Medications as of 10/26/2019  Medication Sig  . ALPRAZolam (XANAX) 0.25 MG tablet Take 0.25 mg by mouth 3 (three) times daily as needed for anxiety.   . chlorthalidone (HYGROTON) 25 MG tablet Take 1 tablet (25 mg total) by mouth daily.  Marland Kitchen diltiazem (CARDIZEM CD) 360 MG 24 hr capsule TAKE ONE CAPSULE BY MOUTH ONCE DAILY  .  ferrous sulfate 325 (65 FE) MG tablet Take 325 mg by mouth 2 (two) times daily.  . hydrALAZINE (APRESOLINE) 100 MG tablet Take 100 mg by mouth 2 (two) times daily.  Marland Kitchen levothyroxine (SYNTHROID, LEVOTHROID) 50 MCG tablet Take 50 mcg by mouth daily.  . metFORMIN (GLUCOPHAGE) 500 MG tablet Take 500 mg by mouth 2 (two) times daily with a meal.   . metoprolol (TOPROL-XL) 200 MG 24 hr tablet Take 200 mg by mouth daily.  Marland Kitchen NOVOLOG MIX 70/30 FLEXPEN (70-30) 100 UNIT/ML Pen Patient inject  33 units in  the morning and 25 units in the evening at bedtime  . omega-3 acid ethyl esters (LOVAZA) 1 G capsule Take 1 g by mouth daily.  Glory Rosebush VERIO test strip 1 each by Other route as needed (testing blood sugar).   . potassium chloride SA (KLOR-CON) 20 MEQ tablet Take 1 tablet (20 mEq total) by mouth 2 (two) times daily.  . pravastatin (PRAVACHOL) 40 MG tablet Take 40 mg by mouth daily.   Alveda Reasons 20 MG TABS tablet TAKE ONE TABLET BY MOUTH ONCE DAILY   No facility-administered encounter medications on file as of 10/26/2019.     REVIEW OF SYSTEMS: All other systems reviewed and negative except where noted in the History of Present Illness.   PHYSICAL EXAM: LMP 11/10/2012   BP (!) 144/64   Pulse 62   Ht 5\' 8"  (1.727 m)   Wt 184 lb (83.5 kg)   LMP 11/10/2012   BMI 27.98 kg/m  General: Well developed 64 year old female in no acute distress. Head: Normocephalic and atraumatic. Eyes:  Sclerae non-icteric, conjunctive pink. Ears: Normal auditory acuity. Mouth: Scattered missing dentition. No ulcers or lesions.  Neck: Supple, no lymphadenopathy or thyromegaly.  Lungs: Clear bilaterally to auscultation without wheezes, crackles or rhonchi. Heart: Regular rate and rhythm. 1/6 systolic murmur. No rub or gallop appreciated.  Abdomen: Soft, nontender, non distended. No masses. No hepatosplenomegaly. Normoactive bowel sounds x 4 quadrants.  Rectal:  Circumferential external hemorrhoids, posterior external hemorrhoid erythematous without active bleeding. No significant internal hemorrhoids. Brown formed stool in the rectal vault guaiac negative.  Musculoskeletal: Symmetrical with no gross deformities. Skin: Warm and dry. No rash or lesions on visible extremities. Extremities: No edema. Neurological: Alert oriented x 4, no focal deficits.  Psychological:  Alert and cooperative. Normal mood and affect.  ASSESSMENT AND PLAN:  63. 64 year old female with a history of atrial fibrillation on Xarelto  with rectal bleeding Q 3 to 4 months, possible due to external hemorrhoids.  -Avoid straining. Miralax PRN -Desitin for hemorrhoid care tid PRN -Colonoscopy benefits and risks discussed including risk with sedation, risk of bleeding, perforation and infection  -Our office will contact cardiologist Dr. Sherren Mocha to verify Xarelto instructions prior to colonoscopy  -Hold Ferrous Sulfate for 1 week prior to colonoscopy   2. History of colon polyps, tubular adenomatous and hyperplastic 4/20214.  -See plan in # 1  3. Chronic IDA anemia x 42 years.  -CBC, iron, iron saturation, TIBC, Ferritin, tTG and IgA -? EGD at time of colonoscopy, defer decision to Dr. Hilarie Fredrickson  4. DM II   Further follow up to be determined after colonoscopy completed         CC:  Mitchell, L.[name], MD

## 2019-10-27 LAB — IRON, TOTAL/TOTAL IRON BINDING CAP
%SAT: 19 % (calc) (ref 16–45)
Iron: 58 ug/dL (ref 45–160)
TIBC: 313 mcg/dL (calc) (ref 250–450)

## 2019-10-27 NOTE — Progress Notes (Signed)
Addendum: Reviewed and agree with assessment and management plan. With iron studies checked yesterday being normal would proceed to colonoscopy only Maleia Weems, Lajuan Lines, MD

## 2019-11-03 ENCOUNTER — Other Ambulatory Visit: Payer: Self-pay

## 2019-11-03 ENCOUNTER — Ambulatory Visit: Payer: BC Managed Care – PPO | Admitting: Physician Assistant

## 2019-11-03 ENCOUNTER — Encounter: Payer: Self-pay | Admitting: Physician Assistant

## 2019-11-03 VITALS — BP 152/82 | HR 65 | Ht 68.0 in | Wt 184.0 lb

## 2019-11-03 DIAGNOSIS — I480 Paroxysmal atrial fibrillation: Secondary | ICD-10-CM

## 2019-11-03 DIAGNOSIS — I472 Ventricular tachycardia, unspecified: Secondary | ICD-10-CM

## 2019-11-03 DIAGNOSIS — I10 Essential (primary) hypertension: Secondary | ICD-10-CM | POA: Diagnosis not present

## 2019-11-03 DIAGNOSIS — I340 Nonrheumatic mitral (valve) insufficiency: Secondary | ICD-10-CM | POA: Diagnosis not present

## 2019-11-03 NOTE — Patient Instructions (Signed)
Medication Instructions:  Continue current medications  *If you need a refill on your cardiac medications before your next appointment, please call your pharmacy*   Testing/Procedures:  Schedule an echocardiogram in 6 mos (1 week before next office visit) Your physician has requested that you have an echocardiogram. Echocardiography is a painless test that uses sound waves to create images of your heart. It provides your doctor with information about the size and shape of your heart and how well your heart's chambers and valves are working. This procedure takes approximately one hour. There are no restrictions for this procedure.   Follow-Up: At [facility], you and your health needs are our priority.  As part of our continuing mission to provide you with exceptional heart care, we have created designated Provider Care Teams.  These Care Teams include your primary Cardiologist (physician) and Advanced Practice Providers (APPs -  Physician Assistants and Nurse Practitioners) who all work together to provide you with the care you need, when you need it.  We recommend signing up for the patient portal called "MyChart".  Sign up information is provided on this After Visit Summary.  MyChart is used to connect with patients for Virtual Visits (Telemedicine).  Patients are able to view lab/test results, encounter notes, upcoming appointments, etc.  Non-urgent messages can be sent to your provider as well.   To learn more about what you can do with MyChart, go to [email or web address].    Your next appointment:   6 month(s)  The format for your next appointment:   In Person  Provider:   Richardson Dopp, PA-C

## 2019-11-03 NOTE — Progress Notes (Signed)
Cardiology Office Note:    Date:  11/03/2019   ID:  Alison Porter, DOB 1956/05/04, MRN [id number]  PCP:  Alroy Dust, Carlean Jews.[name], MD  Cardiologist:  Sherren Mocha, MD / Richardson Dopp, PA-C  Electrophysiologist:  None   Referring MD: Aurea Graff.[name], MD   Chief Complaint:  Follow-up (Atrial fibrillation)    Patient Profile:    Alison Porter is a 64 y.o. female with:   Atrial fibrillation: Paroxysmal.  Admitted 6/13 with afib/RVR, back to NSR on diltiazem.  Echo (7/13): EF 55-60%, mild MR, normal RV.   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  Cardiac MRI (7/13): EF 74%, normal RV size and systolic function with no evidence for ARVC, mild MR, no delayed enhancement.   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  Event Monitor 04/2019: < 1% PVC, PAC burden; 1 run SVT (11 beats)  Sleep apnea  Diabetes mellitus   Hypertension   Hypothyroidism  Hyperlipidemia   Prior CV studies:  Event monitor 04/2019 1) The basic rhythm is normal sinus with an average HR of 66 bpm 2) There are occasional PVC's and PAC's (<1% burden) 3) There is one 11 beat run of SVT, but no sustained arrhythmia 4. There are no bradycardic events or sustained arrhythmias  Cardiac MRI 03/2012 Impression: 1)No evidence of RV dysplasia Normal RV size and function 2)Normal LV EF 74% no hyperenhancemet 3)Mild LAE 4)Mild MR  Event Monitor 01/2012 No significant arrhythmias  Myoview 01/2012 Normal nuclear images and EF.However patient had significant arrhythmia with T wave changes in recovery.? RVOT VT Apparently ECG;s showed to Dr Lovena Le and patient allowed to go home.Discussed with Dr Aundra Dubin who will F/U with patient LV Ejection Fraction: 78%.LV Wall Motion:NL LV Function; NL Wall Motion  Echo 01/2012 EF 55-60%, normal wall motion,  mild MR, mild LAE, PASP 37 mmHg  History of Present Illness:    Ms. Sequin returns for follow-up.  She has been doing well since last seen.  She has not had chest pain, shortness of breath, syncope, palpitations, orthopnea or leg swelling.   Past Medical History:  Diagnosis Date  . Anemia   . Anxiety   . Anxiety   . Atrial fibrillation ([place])   . Heart murmur   . Hypokalemia   . Other and unspecified hyperlipidemia   . Type II or unspecified type diabetes mellitus without mention of complication, not stated as uncontrolled   . Unspecified essential hypertension   . Unspecified hypothyroidism     Current Medications: Current Meds  Medication Sig  . ALPRAZolam (XANAX) 0.25 MG tablet Take 0.25 mg by mouth 3 (three) times daily as needed for anxiety.   . chlorthalidone (HYGROTON) 25 MG tablet Take 1 tablet (25 mg total) by mouth daily.  Marland Kitchen diltiazem (CARDIZEM CD) 360 MG 24 hr capsule TAKE ONE CAPSULE BY MOUTH ONCE DAILY  . ferrous sulfate 325 (65 FE) MG tablet Take 325 mg by mouth 2 (two) times daily.  . hydrALAZINE (APRESOLINE) 100 MG tablet Take 100 mg by mouth 2 (two) times daily.  Marland Kitchen levothyroxine (SYNTHROID, LEVOTHROID) 50 MCG tablet Take 50 mcg by mouth daily.  . metFORMIN (GLUCOPHAGE) 500 MG tablet Take 500 mg by mouth 2 (two) times daily with a meal.   . metoprolol (TOPROL-XL) 200 MG 24 hr tablet Take 200 mg by mouth daily.  Marland Kitchen NOVOLOG MIX 70/30 FLEXPEN (70-30) 100 UNIT/ML Pen Patient  inject  33 units in the morning and 25 units in the evening at bedtime  . omega-3 acid ethyl esters (LOVAZA) 1 G capsule Take 1 g by mouth daily.  . potassium chloride SA (KLOR-CON) 20 MEQ tablet Take 1 tablet (20 mEq total) by mouth 2 (two) times daily.  . pravastatin (PRAVACHOL) 40 MG tablet Take 40 mg by mouth daily.   Alveda Reasons 20 MG TABS tablet TAKE ONE TABLET BY MOUTH ONCE DAILY     Allergies:   Codeine phosphate   Social History   Tobacco Use  . Smoking status: Former Smoker    Years:  15.00    Quit date: 07/10/1983    Years since quitting: 36.3  . Smokeless tobacco: Never Used  Substance Use Topics  . Alcohol use: No  . Drug use: No     Family Hx: The patient's family history includes Cancer in her sister; Diabetes in her brother and sister; Hypertension in her mother; Kidney disease in her sister; Stroke in her mother. There is no history of Breast cancer, Stomach cancer, Colon cancer, Pancreatic cancer, or Esophageal cancer.  ROS   EKGs/Labs/Other Test Reviewed:    EKG:  EKG is  ordered today.  The ekg ordered today demonstrates normal sinus rhythm, heart rate 65, left axis deviation, first-degree AV block, PR interval 216 ms, nonspecific ST-T wave changes, QTC 461 ms, no change since prior tracing  Recent Labs: 05/19/2019: BUN 16; Creatinine, Ser 1.13; Potassium 4.3; Sodium 142 10/26/2019: Hemoglobin 14.3; Platelets 264.0   Labs from primary care 08/18/2019: A1c 7.4; creatinine 0.9, K 3.9, ALT 15, TSH 2.49, TC 163, HDL 38, TG 149, LDL 99  Recent Lipid Panel Lab Results  Component Value Date/Time   CHOL 175 07/15/2014 02:58 PM   TRIG 188.0 (H) 07/15/2014 02:58 PM   HDL 28.90 (L) 07/15/2014 02:58 PM   CHOLHDL 6 07/15/2014 02:58 PM   LDLCALC 109 (H) 07/15/2014 02:58 PM    Physical Exam:    VS:  BP (!) 152/82 (BP Location: Left Arm, Patient Position: Sitting, Cuff Size: Normal)   Pulse 65   Ht 5\' 8"  (1.727 m)   Wt 184 lb (83.5 kg)   LMP 11/10/2012   BMI 27.98 kg/m     Wt Readings from Last 3 Encounters:  11/03/19 184 lb (83.5 kg)  10/26/19 184 lb (83.5 kg)  05/05/19 185 lb 1.9 oz (84 kg)     Constitutional:      Appearance: Healthy appearance. Not in distress.  Neck:     Thyroid: No thyromegaly.     Vascular: JVD normal.  Pulmonary:     Effort: Pulmonary effort is normal.     Breath sounds: No wheezing. No rales.  Cardiovascular:     Normal rate. Regular rhythm. Normal S1. Normal S2.     Murmurs: There is a grade 2/6 early systolic murmur at  the URSB.  Edema:    Peripheral edema absent.  Abdominal:     Palpations: Abdomen is soft. There is no hepatomegaly.  Skin:    General: Skin is warm and dry.  Neurological:     Mental Status: Alert and oriented to person, place and time.     Cranial Nerves: Cranial nerves are intact.      ASSESSMENT & PLAN:    1. PAF (paroxysmal atrial fibrillation) (HCC) Maintaining sinus rhythm.  She is tolerating anticoagulation.  Recent hemoglobin was normal.  Recent creatinine was normal.  Continue current dose of diltiazem, metoprolol succinate,  rivaroxaban. Lab Results  Component Value Date   CREATININE 1.13 (H) 05/19/2019   HGB 14.3 10/26/2019    2. VT (ventricular tachycardia) (HCC) History of RVOT ventricular tachycardia.  Cardiac MRI in the past did not demonstrate evidence of ARVC.  Continue beta-blocker, calcium channel blocker.  3. Essential hypertension Blood pressure is always elevated when she comes into clinic.  Her blood pressures at home have been optimal.  Blood pressure seems to be well controlled.  Continue current dose of chlorthalidone, diltiazem, hydralazine, metoprolol succinate.  4. Nonrheumatic mitral valve regurgitation She has a systolic murmur on exam which has been noted for years.  Her last echocardiogram was in 2013.  She had mild mitral regurgitation at that time.  Since it has been 8 years since her last echocardiogram, I have recommended that we repeat an echocardiogram to ensure that her mitral regurgitation has not worsened over time.   Dispo:  Return in about 6 months (around 05/04/2020) for Routine Follow Up, w/ Richardson Dopp, PA-C, in person.   Medication Adjustments/Labs and Tests Ordered: Current medicines are reviewed at length with the patient today.  Concerns regarding medicines are outlined above.  Tests Ordered: Orders Placed This Encounter  Procedures  . EKG 12-Lead  . ECHOCARDIOGRAM COMPLETE   Medication Changes: No orders of the defined  types were placed in this encounter.   Signed, Richardson Dopp, PA-C  11/03/2019 5:24 PM    [facility] [address], [place], [place]  [address] Phone: [phone]; Fax: [phone]

## 2019-11-13 ENCOUNTER — Other Ambulatory Visit: Payer: Self-pay

## 2019-11-13 ENCOUNTER — Ambulatory Visit
Admission: RE | Admit: 2019-11-13 | Discharge: 2019-11-13 | Disposition: A | Discharge status: Home / Self Care | Payer: BC Managed Care – PPO | Source: Ambulatory Visit | Attending: Family Medicine | Admitting: Family Medicine

## 2019-11-13 ENCOUNTER — Other Ambulatory Visit: Payer: Self-pay | Admitting: Family Medicine

## 2019-11-13 DIAGNOSIS — N6320 Unspecified lump in the left breast, unspecified quadrant: Secondary | ICD-10-CM

## 2020-04-27 ENCOUNTER — Ambulatory Visit (HOSPITAL_COMMUNITY): Payer: BC Managed Care – PPO | Attending: Cardiology

## 2020-04-27 ENCOUNTER — Encounter: Payer: Self-pay | Admitting: Physician Assistant

## 2020-04-27 ENCOUNTER — Other Ambulatory Visit: Payer: Self-pay

## 2020-04-27 DIAGNOSIS — I480 Paroxysmal atrial fibrillation: Secondary | ICD-10-CM

## 2020-04-27 DIAGNOSIS — I340 Nonrheumatic mitral (valve) insufficiency: Secondary | ICD-10-CM | POA: Diagnosis not present

## 2020-04-27 LAB — ECHOCARDIOGRAM COMPLETE
Area-P 1/2: 2.62 cm2
S' Lateral: 2.7 cm

## 2020-05-06 ENCOUNTER — Ambulatory Visit: Payer: BC Managed Care – PPO | Admitting: Physician Assistant

## 2020-05-10 NOTE — Progress Notes (Addendum)
Virtual Visit via Telephone Note   This visit type was conducted due to national recommendations for restrictions regarding the COVID-19 Pandemic (e.g. social distancing) in an effort to limit this patient's exposure and mitigate transmission in our community.  Due to her co-morbid illnesses, this patient is at least at moderate risk for complications without adequate follow up.  This format is felt to be most appropriate for this patient at this time.  The patient did not have access to video technology/had technical difficulties with video requiring transitioning to audio format only (telephone).  All issues noted in this document were discussed and addressed.  No physical exam could be performed with this format.  Please refer to the patient's chart for her  consent to telehealth for [facility].    Date:  05/11/2020   ID:  [place], DOB 03-01-1956, MRN [id number] The patient was identified using 2 identifiers.  Patient Location: Home Provider Location: Home Office  PCP:  Alison Porter, Alison Sa, MD  Cardiologist:  Alison Mocha, MD   Electrophysiologist:  None   Evaluation Performed:  Follow-Up Visit  Chief Complaint:  Follow-up (Atrial fibrillation, ventricular tachycardia, hypertension)    Patient Profile: Alison Porter is a 64 y.o. female with:  Atrial fibrillation: Paroxysmal. ? Admitted 6/13 with afib/RVR, back to [facility] on diltiazem.  Echo (7/13): EF 55-60%, mild MR, normal RV.   Ventricular tachycardia:  ? 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 ? Cardiac MRI (7/13): EF 74%, normal RV size and systolic function with no evidence for ARVC, mild MR, no delayed enhancement.  ? 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 ? Event Monitor 04/2019: < 1% PVC, PAC burden; 1 run SVT (11 beats) ? Echo 10/21: EF 60-65, Gr 1 DD, trivial MR  Sleep apnea  Diabetes  mellitus   Hypertension   Hypothyroidism  Hyperlipidemia  Prior CV studies: Echocardiogram 04/27/20 EF 60-65, no RWMA, Gr 1 D, normal RVSF, RVSP 33.7, trivial MR, ascending aorta 37 mm  Event monitor 04/2019 1) The basic rhythm is normal sinus with an average HR of 66 bpm 2) There are occasional PVC's and PAC's (<1% burden) 3) There is one 11 beat run of SVT, but no sustained arrhythmia 4. There are no bradycardic events or sustained arrhythmias  Cardiac MRI 03/2012 Impression: 1)No evidence of RV dysplasia Normal RV size and function 2)Normal LV EF 74% no hyperenhancemet 3)Mild LAE 4)Mild MR  Event Monitor 01/2012 No significant arrhythmias  Myoview 01/2012 Normal nuclear images and EF.However patient had significant arrhythmia with T wave changes in recovery.? RVOT VT Apparently ECG;s showed to Alison Porter and patient allowed to go home.Discussed with Alison Porter who will F/U with patient LV Ejection Fraction: 78%.LV Wall Motion:NL LV Function; NL Wall Motion  Echo 01/2012 EF 55-60%, normal wall motion, mild MR, mild LAE, PASP 37 mmHg   History of Present Illness:   Alison Porter was last seen in 4/21 in clinic.  She is seen for follow up.  She just had a follow up echocardiogram which demonstrated normal EF, mild diastolic dysfunction and just trivial MR. Today, she notes she is doing well.  She has not had chest discomfort or significant shortness of breath.  She has not had syncope or dizziness.  She has not had any melena or hematochezia.  She notes that her blood pressure sometimes drops into the 1 teens.  However, she does not feel poorly  with this.   Past Medical History:  Diagnosis Date  . Anemia   . Anxiety   . Anxiety   . Atrial fibrillation ([place])   . Heart murmur    Echo 10/21: EF 60-65, no RWMA, GR 1 DD, normal RVSF, RVSP 33.7, trivial MR, borderline dilation of ascending aorta (37 mm)  . Hypokalemia   . Other and unspecified  hyperlipidemia   . Type II or unspecified type diabetes mellitus without mention of complication, not stated as uncontrolled   . Unspecified essential hypertension   . Unspecified hypothyroidism    Past Surgical History:  Procedure Laterality Date  . EYE SURGERY Left      Current Meds  Medication Sig  . ALPRAZolam (XANAX) 0.25 MG tablet Take 0.25 mg by mouth 3 (three) times daily as needed for anxiety.   . chlorthalidone (HYGROTON) 25 MG tablet Take 0.5 tablets (12.5 mg total) by mouth daily.  Marland Kitchen diltiazem (CARDIZEM CD) 360 MG 24 hr capsule TAKE ONE CAPSULE BY MOUTH ONCE DAILY  . ferrous sulfate 325 (65 FE) MG tablet Take 325 mg by mouth 2 (two) times daily.  . hydrALAZINE (APRESOLINE) 100 MG tablet Take 100 mg by mouth 2 (two) times daily.  Marland Kitchen levothyroxine (SYNTHROID, LEVOTHROID) 50 MCG tablet Take 50 mcg by mouth daily.  . metFORMIN (GLUCOPHAGE) 500 MG tablet Take 500 mg by mouth 2 (two) times daily with a meal.   . metoprolol (TOPROL-XL) 200 MG 24 hr tablet Take 200 mg by mouth daily.  Marland Kitchen NOVOLOG MIX 70/30 FLEXPEN (70-30) 100 UNIT/ML Pen Patient inject  35 units in the morning and 25 units in the evening at bedtime  . omega-3 acid ethyl esters (LOVAZA) 1 G capsule Take 1 g by mouth daily.  . potassium chloride Porter (KLOR-CON) 20 MEQ tablet Take 1 tablet (20 mEq total) by mouth daily.  . pravastatin (PRAVACHOL) 40 MG tablet Take 40 mg by mouth daily.   Alison Porter 20 MG TABS tablet TAKE ONE TABLET BY MOUTH ONCE DAILY  . [DISCONTINUED] chlorthalidone (HYGROTON) 25 MG tablet Take 1 tablet (25 mg total) by mouth daily.  . [DISCONTINUED] potassium chloride Porter (KLOR-CON) 20 MEQ tablet Take 1 tablet (20 mEq total) by mouth 2 (two) times daily. (Patient taking differently: Take 20 mEq by mouth daily. )     Allergies:   Codeine phosphate   Social History   Tobacco Use  . Smoking status: Former Smoker    Years: 15.00    Quit date: 07/10/1983    Years since quitting: 36.8  . Smokeless tobacco:  Never Used  Vaping Use  . Vaping Use: Never used  Substance Use Topics  . Alcohol use: No  . Drug use: No     Family Hx: The patient's family history includes Cancer in her sister; Diabetes in her brother and sister; Hypertension in her mother; Kidney disease in her sister; Stroke in her mother. There is no history of Breast cancer, Stomach cancer, Colon cancer, Pancreatic cancer, or Esophageal cancer.  ROS:   Please see the history of present illness.      Labs/Other Tests and Data Reviewed:    EKG:  No ECG reviewed.  Recent Labs: 05/19/2019: BUN 16; Creatinine, Ser 1.13; Potassium 4.3; Sodium 142 10/26/2019: Hemoglobin 14.3; Platelets 264.0   Recent Lipid Panel Lab Results  Component Value Date/Time   CHOL 175 07/15/2014 02:58 PM   TRIG 188.0 (H) 07/15/2014 02:58 PM   HDL 28.90 (L) 07/15/2014 02:58 PM  CHOLHDL 6 07/15/2014 02:58 PM   LDLCALC 109 (H) 07/15/2014 02:58 PM   Labs from PCP personally reviewed and interpreted ([place]) 08/18/2019: Total cholesterol 163, HDL 38, LDL 99, triglycerides 149, ALT 15, TSH 2.49 02/17/2020: A1c 7.6, creatinine 0.85  Wt Readings from Last 3 Encounters:  05/11/20 184 lb (83.5 kg)  11/03/19 184 lb (83.5 kg)  10/26/19 184 lb (83.5 kg)     Risk Assessment/Calculations:     CHA2DS2-VASc Score = 3  This indicates a 3.2% annual risk of stroke. The patient's score is based upon: CHF History: 0 HTN History: 1 Diabetes History: 1 Stroke History: 0 Vascular Disease History: 0 Age Score: 0 Gender Score: 1     Objective:    Vital Signs:  BP 137/72   Pulse 62   Ht 5\' 8"  (1.727 m)   Wt 184 lb (83.5 kg)   LMP 11/10/2012   BMI 27.98 kg/m    VITAL SIGNS:  reviewed GEN:  no acute distress PSYCH:  normal affect  ASSESSMENT & PLAN:    1. PAF (paroxysmal atrial fibrillation) (HCC) CHA2DS2-VASc Score = 3 [CHF History: 0, HTN History: 1, Diabetes History: 1, Stroke History: 0, Vascular Disease History: 0, Age Score: 0, Gender Score: 1].   Therefore, the patient's annual risk of stroke is 3.2 %.   She seems to be tolerating anticoagulation.  Recent creatinine, hemoglobin stable.  Continue current dose of Rivaroxaban.     2. Essential hypertension Overall, her blood pressure seems to be well controlled.  We discussed optimal blood pressure is considered 120/80 or less.  I have encouraged her to continue on her current medical regimen.  If she has blood pressures in the low 100s or if she starts to have symptomatic low blood pressure, she should contact us so that we can adjust her medications.  Otherwise, continue current medical regimen.     Time:   Today, I have spent 8 minutes with the patient with telehealth technology discussing the above problems.     Medication Adjustments/Labs and Tests Ordered: Current medicines are reviewed at length with the patient today.  Concerns regarding medicines are outlined above.   Tests Ordered: No orders of the defined types were placed in this encounter.   Medication Changes: Meds ordered this encounter  Medications  . chlorthalidone (HYGROTON) 25 MG tablet    Sig: Take 0.5 tablets (12.5 mg total) by mouth daily.    Dispense:  45 tablet    Refill:  3  . potassium chloride Porter (KLOR-CON) 20 MEQ tablet    Sig: Take 1 tablet (20 mEq total) by mouth daily.    Dispense:  90 tablet    Refill:  3    Follow Up:  In Person in 6 month(s)  Signed, Richardson Dopp, PA-C  05/11/2020 5:00 PM    [place]

## 2020-05-11 ENCOUNTER — Encounter: Payer: Self-pay | Admitting: Physician Assistant

## 2020-05-11 ENCOUNTER — Other Ambulatory Visit: Payer: Self-pay

## 2020-05-11 ENCOUNTER — Telehealth (INDEPENDENT_AMBULATORY_CARE_PROVIDER_SITE_OTHER): Payer: BC Managed Care – PPO | Admitting: Physician Assistant

## 2020-05-11 VITALS — BP 137/72 | HR 62 | Ht 68.0 in | Wt 184.0 lb

## 2020-05-11 DIAGNOSIS — I10 Essential (primary) hypertension: Secondary | ICD-10-CM

## 2020-05-11 DIAGNOSIS — E782 Mixed hyperlipidemia: Secondary | ICD-10-CM

## 2020-05-11 DIAGNOSIS — I340 Nonrheumatic mitral (valve) insufficiency: Secondary | ICD-10-CM

## 2020-05-11 DIAGNOSIS — I480 Paroxysmal atrial fibrillation: Secondary | ICD-10-CM | POA: Diagnosis not present

## 2020-05-11 DIAGNOSIS — I472 Ventricular tachycardia: Secondary | ICD-10-CM

## 2020-05-11 MED ORDER — CHLORTHALIDONE 25 MG PO TABS: 12.5000 mg | ORAL_TABLET | Freq: Every day | ORAL | 3 refills | Status: DC

## 2020-05-11 MED ORDER — POTASSIUM CHLORIDE CRYS ER 20 MEQ PO TBCR: 20.0000 meq | EXTENDED_RELEASE_TABLET | Freq: Every day | ORAL | 3 refills | Status: DC

## 2020-05-11 NOTE — Patient Instructions (Signed)
Medication Instructions:  Your physician recommends that you continue on your current medications as directed. Please refer to the Current Medication list given to you today.  *If you need a refill on your cardiac medications before your next appointment, please call your pharmacy*  Lab Work: None ordered today  Testing/Procedures: None ordered today  Follow-Up: At [facility], you and your health needs are our priority.  As part of our continuing mission to provide you with exceptional heart care, we have created designated Provider Care Teams.  These Care Teams include your primary Cardiologist (physician) and Advanced Practice Providers (APPs -  Physician Assistants and Nurse Practitioners) who all work together to provide you with the care you need, when you need it.  We recommend signing up for the patient portal called "MyChart".  Sign up information is provided on this After Visit Summary.  MyChart is used to connect with patients for Virtual Visits (Telemedicine).  Patients are able to view lab/test results, encounter notes, upcoming appointments, etc.  Non-urgent messages can be sent to your provider as well.   To learn more about what you can do with MyChart, go to [email or web address].    Your next appointment:   6 month(s)  The format for your next appointment:   In Person  Provider:   Scott Weaver, PA-C   

## 2020-05-16 ENCOUNTER — Other Ambulatory Visit: Payer: Self-pay

## 2020-05-16 ENCOUNTER — Ambulatory Visit
Admission: RE | Admit: 2020-05-16 | Discharge: 2020-05-16 | Disposition: A | Discharge status: Home / Self Care | Payer: BC Managed Care – PPO | Source: Ambulatory Visit | Attending: Family Medicine | Admitting: Family Medicine

## 2020-05-16 DIAGNOSIS — N6320 Unspecified lump in the left breast, unspecified quadrant: Secondary | ICD-10-CM

## 2020-11-29 ENCOUNTER — Telehealth: Payer: Self-pay | Admitting: Cardiovascular Disease

## 2020-11-29 NOTE — Telephone Encounter (Signed)
Rescheduled patient for 11/30/20 to see Ermalinda Barrios at 10:45 am

## 2020-11-30 ENCOUNTER — Other Ambulatory Visit: Payer: Self-pay

## 2020-11-30 ENCOUNTER — Encounter: Payer: Self-pay | Admitting: Physician Assistant

## 2020-11-30 ENCOUNTER — Ambulatory Visit: Payer: BC Managed Care – PPO | Admitting: Physician Assistant

## 2020-11-30 VITALS — BP 164/70 | HR 64 | Ht 68.0 in | Wt 181.2 lb

## 2020-11-30 DIAGNOSIS — E7849 Other hyperlipidemia: Secondary | ICD-10-CM | POA: Diagnosis not present

## 2020-11-30 DIAGNOSIS — I10 Essential (primary) hypertension: Secondary | ICD-10-CM | POA: Diagnosis not present

## 2020-11-30 DIAGNOSIS — I472 Ventricular tachycardia, unspecified: Secondary | ICD-10-CM

## 2020-11-30 DIAGNOSIS — I480 Paroxysmal atrial fibrillation: Secondary | ICD-10-CM | POA: Diagnosis not present

## 2020-11-30 DIAGNOSIS — G4733 Obstructive sleep apnea (adult) (pediatric): Secondary | ICD-10-CM

## 2020-11-30 NOTE — Progress Notes (Signed)
Cardiology Office Note    Date:  11/30/2020   ID:  Alison Porter 17, 1957, MRN [id number]   PCP:  Alroy Dust, Carlean Jews.[name], [facility]  Cardiologist:  Sherren Mocha, MD  Advanced Practice Provider:  No care team member to display Electrophysiologist:  None   [id number]}   Chief Complaint  Patient presents with  . Follow-up    History of Present Illness:  Alison Porter is a 65 y.o. female with history of PAF 12/2011 CHA2DS2-VASc equals 3 EF 55-60%, VT during GXT -myoview no ischemia suspect RVOT with LBBB/inf axis morphology-MRI no evidence of ARVC treated with BB and CCB, OSA not on CPAP-had it done in [place], HTN, HLD, DM.  Patient had a telemedicine visit with with Alison Dopp, PA-C 05/2020 was doing well.  Patient comes in for f/u. Denies chest pain, dyspnea, palpitations, dizziness.  Has occasional edema as the day goes on.  Getting extra salt in her diet.  Was walking 20-30 min/day but just moved so hasn't done much recently. Takes care her 83 month old grand daughter.Keeps track of BP at home and doing well-hasn't had to use chlorthalidone. High here today and was 125/56 this am at home. Labs reviewed from Feb were stable.   Past Medical History:  Diagnosis Date  . Anemia   . Anxiety   . Anxiety   . Atrial fibrillation ([place])   . Heart murmur    Echo 10/21: EF 60-65, no RWMA, GR 1 DD, normal RVSF, RVSP 33.7, trivial MR, borderline dilation of ascending aorta (37 mm)  . Hypokalemia   . Other and unspecified hyperlipidemia   . Type II or unspecified type diabetes mellitus without mention of complication, not stated as uncontrolled   . Unspecified essential hypertension   . Unspecified hypothyroidism     Past Surgical History:  Procedure Laterality Date  . EYE SURGERY Left     Current Medications: Current Meds  Medication Sig  . ALPRAZolam (XANAX) 0.25 MG tablet Take 0.25 mg by mouth 3 (three) times daily as needed for anxiety.    . chlorthalidone (HYGROTON) 25 MG tablet Take 12.5 mg by mouth as needed.  . diltiazem (CARDIZEM CD) 360 MG 24 hr capsule TAKE ONE CAPSULE BY MOUTH ONCE DAILY  . ferrous sulfate 325 (65 FE) MG tablet Take 325 mg by mouth 2 (two) times daily.  . hydrALAZINE (APRESOLINE) 100 MG tablet Take 100 mg by mouth 2 (two) times daily.  Marland Kitchen levothyroxine (SYNTHROID, LEVOTHROID) 50 MCG tablet Take 50 mcg by mouth daily.  . metFORMIN (GLUCOPHAGE) 500 MG tablet Take 500 mg by mouth 2 (two) times daily with a meal.   . metoprolol (TOPROL-XL) 200 MG 24 hr tablet Take 200 mg by mouth daily.  Marland Kitchen NOVOLOG MIX 70/30 FLEXPEN (70-30) 100 UNIT/ML Pen Patient inject  35 units in the morning and 25 units in the evening at bedtime  . omega-3 acid ethyl esters (LOVAZA) 1 G capsule Take 1 g by mouth daily.  . potassium chloride SA (KLOR-CON) 20 MEQ tablet Take 20 mEq by mouth as needed.  . pravastatin (PRAVACHOL) 40 MG tablet Take 40 mg by mouth daily.   Alveda Reasons 20 MG TABS tablet TAKE ONE TABLET BY MOUTH ONCE DAILY  . [DISCONTINUED] chlorthalidone (HYGROTON) 25 MG tablet Take 0.5 tablets (12.5 mg total) by mouth daily.  . [DISCONTINUED] potassium chloride SA (KLOR-CON) 20 MEQ tablet Take 1 tablet (20 mEq total) by mouth daily.  Allergies:   Codeine phosphate   Social History   Socioeconomic History  . Marital status: Single    Spouse name: Not on file  . Number of children: 4  . Years of education: Not on file  . Highest education level: Not on file  Occupational History  . Occupation: retired  Tobacco Use  . Smoking status: Former Smoker    Years: 15.00    Quit date: 07/10/1983    Years since quitting: 37.4  . Smokeless tobacco: Never Used  Vaping Use  . Vaping Use: Never used  Substance and Sexual Activity  . Alcohol use: No  . Drug use: No  . Sexual activity: Not on file  Other Topics Concern  . Not on file  Social History Narrative  . Not on file   Social Determinants of Health   Financial  Resource Strain: Not on file  Food Insecurity: Not on file  Transportation Needs: Not on file  Physical Activity: Not on file  Stress: Not on file  Social Connections: Not on file     Family History:  The patient's family history includes Cancer in her sister; Diabetes in her brother and sister; Hypertension in her mother; Kidney disease in her sister; Stroke in her mother.   ROS:   Please see the history of present illness.    ROS All other systems reviewed and are negative.   PHYSICAL EXAM:   VS:  BP (!) 164/70   Pulse 64   Ht 5\' 8"  (1.727 m)   Wt 181 lb 3.2 oz (82.2 kg)   LMP 11/10/2012   SpO2 99%   BMI 27.55 kg/m   Physical Exam  GEN: Well nourished, well developed, in no acute distress  Neck: no JVD, carotid bruits, or masses Cardiac:RRR; 2/6 systolic murmur in the left sternal border Respiratory:  clear to auscultation bilaterally, normal work of breathing GI: soft, nontender, nondistended, + BS Ext: without cyanosis, clubbing, or edema, Good distal pulses bilaterally Neuro:  Alert and Oriented x 3 Psych: euthymic mood, full affect  Wt Readings from Last 3 Encounters:  11/30/20 181 lb 3.2 oz (82.2 kg)  05/11/20 184 lb (83.5 kg)  11/03/19 184 lb (83.5 kg)      Studies/Labs Reviewed:   EKG:  EKG is not ordered today.   Recent Labs: No results found for requested labs within last 8760 hours.   Lipid Panel    Component Value Date/Time   CHOL 175 07/15/2014 1458   TRIG 188.0 (H) 07/15/2014 1458   HDL 28.90 (L) 07/15/2014 1458   CHOLHDL 6 07/15/2014 1458   VLDL 37.6 07/15/2014 1458   LDLCALC 109 (H) 07/15/2014 1458    Additional studies/ records that were reviewed today include:  Echo 04/2020  IMPRESSIONS     1. Left ventricular ejection fraction, by estimation, is 60 to 65%. The  left ventricle has normal function. The left ventricle has no regional  wall motion abnormalities. Left ventricular diastolic parameters are  consistent with Grade I  diastolic  dysfunction (impaired relaxation). Elevated left ventricular end-diastolic  pressure.   2. Right ventricular systolic function is normal. The right ventricular  size is mildly enlarged. There is normal pulmonary artery systolic  pressure. The estimated right ventricular systolic pressure is 93.8 mmHg.   3. The mitral valve is normal in structure. Trivial mitral valve  regurgitation. No evidence of mitral stenosis.   4. The aortic valve is normal in structure. Aortic valve regurgitation is  not visualized. No  aortic stenosis is present.   5. Aortic dilatation noted. There is borderline dilatation of the  ascending aorta, measuring 37 mm.   6. The inferior vena cava is normal in size with greater than 50%  respiratory variability, suggesting right atrial pressure of 3 mmHg.    Risk Assessment/Calculations:    CHA2DS2-VASc Score = 3  This indicates a 3.2% annual risk of stroke. The patient's score is based upon: CHF History: No HTN History: Yes Diabetes History: Yes Stroke History: No Vascular Disease History: No Age Score: 0 Gender Score: 1        ASSESSMENT:    1. PAF (paroxysmal atrial fibrillation) ([place])   2. VT (ventricular tachycardia) ([place])   3. Essential hypertension   4. Other hyperlipidemia   5. Obstructive sleep apnea syndrome      PLAN:  In order of problems listed above:  PAF CHA2DS2-VASc equals 3 on Xarelto no bleeding problems.  Had stable labs in Feb.   VT during GXT -myoview no ischemia suspect RVOT with LBBB/inf axis morphology-MRI no evidence of ARVC treated with BB and CCB  Hypertension blood pressure up today but she keeps close track of it at home and it has been stable.  Continue current treatment.  2 g sodium diet.  HLD LDL 82 February 2022  DM2 A1c 6.9 in February.  Says it is doing much better.  OSA diagnosed on sleep study in [place] 5 years ago.  She never started CPAP.  We will have her do a sleep study and refer to  treatment if indicated    Shared Decision Making/Informed Consent        Medication Adjustments/Labs and Tests Ordered: Current medicines are reviewed at length with the patient today.  Concerns regarding medicines are outlined above.  Medication changes, Labs and Tests ordered today are listed in the Patient Instructions below. Patient Instructions   Medication Instructions:  Your physician recommends that you continue on your current medications as directed. Please refer to the Current Medication list given to you today.  Labwork: None ordered.  Testing/Procedures: Your physician has recommended that you have a sleep study. This test records several body functions during sleep, including: brain activity, eye movement, oxygen and carbon dioxide blood levels, heart rate and rhythm, breathing rate and rhythm, the flow of air through your mouth and nose, snoring, body muscle movements, and chest and belly movement.   Follow-Up: Your physician recommends that you schedule a follow-up appointment in:   6 months with Dr. Burt Knack  Any Other Special Instructions Will Be Listed Below (If Applicable).  Please keep your dietary sodium under 2000mg  each day.  If you need a refill on your cardiac medications before your next appointment, please call your pharmacy.    Low-Sodium Eating Plan Sodium, which is an element that makes up salt, helps you maintain a healthy balance of fluids in your body. Too much sodium can increase your blood pressure and cause fluid and waste to be held in your body. Your health care provider or dietitian may recommend following this plan if you have high blood pressure (hypertension), kidney disease, liver disease, or heart failure. Eating less sodium can help lower your blood pressure, reduce swelling, and protect your heart, liver, and kidneys. What are tips for following this plan? Reading food labels  The Nutrition Facts label lists the amount of sodium in  one serving of the food. If you eat more than one serving, you must multiply the listed amount of  sodium by the number of servings.  Choose foods with less than 140 mg of sodium per serving.  Avoid foods with 300 mg of sodium or more per serving. Shopping  Look for lower-sodium products, often labeled as "low-sodium" or "no salt added."  Always check the sodium content, even if foods are labeled as "unsalted" or "no salt added."  Buy fresh foods. ? Avoid canned foods and pre-made or frozen meals. ? Avoid canned, cured, or processed meats.  Buy breads that have less than 80 mg of sodium per slice.   Cooking  Eat more home-cooked food and less restaurant, buffet, and fast food.  Avoid adding salt when cooking. Use salt-free seasonings or herbs instead of table salt or sea salt. Check with your health care provider or pharmacist before using salt substitutes.  Cook with plant-based oils, such as canola, sunflower, or olive oil.   Meal planning  When eating at a restaurant, ask that your food be prepared with less salt or no salt, if possible. Avoid dishes labeled as brined, pickled, cured, smoked, or made with soy sauce, miso, or teriyaki sauce.  Avoid foods that contain MSG (monosodium glutamate). MSG is sometimes added to [place] food, bouillon, and some canned foods.  Make meals that can be grilled, baked, poached, roasted, or steamed. These are generally made with less sodium. General information Most people on this plan should limit their sodium intake to 1,500-2,000 mg (milligrams) of sodium each day. What foods should I eat? Fruits Fresh, frozen, or canned fruit. Fruit juice. Vegetables Fresh or frozen vegetables. "No salt added" canned vegetables. "No salt added" tomato sauce and paste. Low-sodium or reduced-sodium tomato and vegetable juice. Grains Low-sodium cereals, including oats, puffed wheat and rice, and shredded wheat. Low-sodium crackers. Unsalted rice. Unsalted  pasta. Low-sodium bread. Whole-grain breads and whole-grain pasta. Meats and other proteins Fresh or frozen (no salt added) meat, poultry, seafood, and fish. Low-sodium canned tuna and salmon. Unsalted nuts. Dried peas, beans, and lentils without added salt. Unsalted canned beans. Eggs. Unsalted nut butters. Dairy Milk. Soy milk. Cheese that is naturally low in sodium, such as ricotta cheese, fresh mozzarella, or Swiss cheese. Low-sodium or reduced-sodium cheese. Cream cheese. Yogurt. Seasonings and condiments Fresh and dried herbs and spices. Salt-free seasonings. Low-sodium mustard and ketchup. Sodium-free salad dressing. Sodium-free light mayonnaise. Fresh or refrigerated horseradish. Lemon juice. Vinegar. Other foods Homemade, reduced-sodium, or low-sodium soups. Unsalted popcorn and pretzels. Low-salt or salt-free chips. The items listed above may not be a complete list of foods and beverages you can eat. Contact a dietitian for more information. What foods should I avoid? Vegetables Sauerkraut, pickled vegetables, and relishes. Olives. [place] fries. Onion rings. Regular canned vegetables (not low-sodium or reduced-sodium). Regular canned tomato sauce and paste (not low-sodium or reduced-sodium). Regular tomato and vegetable juice (not low-sodium or reduced-sodium). Frozen vegetables in sauces. Grains Instant hot cereals. Bread stuffing, pancake, and biscuit mixes. Croutons. Seasoned rice or pasta mixes. Noodle soup cups. Boxed or frozen macaroni and cheese. Regular salted crackers. Self-rising flour. Meats and other proteins Meat or fish that is salted, canned, smoked, spiced, or pickled. Precooked or cured meat, such as sausages or meat loaves. Berniece Salines. Ham. Pepperoni. Hot dogs. Corned beef. Chipped beef. Salt pork. Jerky. Pickled herring. Anchovies and sardines. Regular canned tuna. Salted nuts. Dairy Processed cheese and cheese spreads. Hard cheeses. Cheese curds. Blue cheese. Feta cheese.  String cheese. Regular cottage cheese. Buttermilk. Canned milk. Fats and oils Salted butter. Regular margarine. Ghee. Bacon fat.  Seasonings and condiments Onion salt, garlic salt, seasoned salt, table salt, and sea salt. Canned and packaged gravies. Worcestershire sauce. Tartar sauce. Barbecue sauce. Teriyaki sauce. Soy sauce, including reduced-sodium. Steak sauce. Fish sauce. Oyster sauce. Cocktail sauce. Horseradish that you find on the shelf. Regular ketchup and mustard. Meat flavorings and tenderizers. Bouillon cubes. Hot sauce. Pre-made or packaged marinades. Pre-made or packaged taco seasonings. Relishes. Regular salad dressings. Salsa. Other foods Salted popcorn and pretzels. Corn chips and puffs. Potato and tortilla chips. Canned or dried soups. Pizza. Frozen entrees and pot pies. The items listed above may not be a complete list of foods and beverages you should avoid. Contact a dietitian for more information. Summary  Eating less sodium can help lower your blood pressure, reduce swelling, and protect your heart, liver, and kidneys.  Most people on this plan should limit their sodium intake to 1,500-2,000 mg (milligrams) of sodium each day.  Canned, boxed, and frozen foods are high in sodium. Restaurant foods, fast foods, and pizza are also very high in sodium. You also get sodium by adding salt to food.  Try to cook at home, eat more fresh fruits and vegetables, and eat less fast food and canned, processed, or prepared foods. This information is not intended to replace advice given to you by your health care provider. Make sure you discuss any questions you have with your health care provider. Document Revised: 07/31/2019 Document Reviewed: 05/27/2019 Elsevier Patient Education  2021 [place], Ermalinda Barrios, [place]  11/30/2020 11:19 AM    [facility] [place], [place], [place]  [address] Phone: [phone]; Fax: [phone]

## 2020-11-30 NOTE — Patient Instructions (Addendum)
Medication Instructions:  Your physician recommends that you continue on your current medications as directed. Please refer to the Current Medication list given to you today.  Labwork: None ordered.  Testing/Procedures: Your physician has recommended that you have a sleep study. This test records several body functions during sleep, including: brain activity, eye movement, oxygen and carbon dioxide blood levels, heart rate and rhythm, breathing rate and rhythm, the flow of air through your mouth and nose, snoring, body muscle movements, and chest and belly movement.   Follow-Up: Your physician recommends that you schedule a follow-up appointment in:   6 months with Dr. Burt Knack  Any Other Special Instructions Will Be Listed Below (If Applicable).  Please keep your dietary sodium under 2000mg  each day.  If you need a refill on your cardiac medications before your next appointment, please call your pharmacy.    Low-Sodium Eating Plan Sodium, which is an element that makes up salt, helps you maintain a healthy balance of fluids in your body. Too much sodium can increase your blood pressure and cause fluid and waste to be held in your body. Your health care provider or dietitian may recommend following this plan if you have high blood pressure (hypertension), kidney disease, liver disease, or heart failure. Eating less sodium can help lower your blood pressure, reduce swelling, and protect your heart, liver, and kidneys. What are tips for following this plan? Reading food labels  The Nutrition Facts label lists the amount of sodium in one serving of the food. If you eat more than one serving, you must multiply the listed amount of sodium by the number of servings.  Choose foods with less than 140 mg of sodium per serving.  Avoid foods with 300 mg of sodium or more per serving. Shopping  Look for lower-sodium products, often labeled as "low-sodium" or "no salt added."  Always check the  sodium content, even if foods are labeled as "unsalted" or "no salt added."  Buy fresh foods. ? Avoid canned foods and pre-made or frozen meals. ? Avoid canned, cured, or processed meats.  Buy breads that have less than 80 mg of sodium per slice.   Cooking  Eat more home-cooked food and less restaurant, buffet, and fast food.  Avoid adding salt when cooking. Use salt-free seasonings or herbs instead of table salt or sea salt. Check with your health care provider or pharmacist before using salt substitutes.  Cook with plant-based oils, such as canola, sunflower, or olive oil.   Meal planning  When eating at a restaurant, ask that your food be prepared with less salt or no salt, if possible. Avoid dishes labeled as brined, pickled, cured, smoked, or made with soy sauce, miso, or teriyaki sauce.  Avoid foods that contain MSG (monosodium glutamate). MSG is sometimes added to [place] food, bouillon, and some canned foods.  Make meals that can be grilled, baked, poached, roasted, or steamed. These are generally made with less sodium. General information Most people on this plan should limit their sodium intake to 1,500-2,000 mg (milligrams) of sodium each day. What foods should I eat? Fruits Fresh, frozen, or canned fruit. Fruit juice. Vegetables Fresh or frozen vegetables. "No salt added" canned vegetables. "No salt added" tomato sauce and paste. Low-sodium or reduced-sodium tomato and vegetable juice. Grains Low-sodium cereals, including oats, puffed wheat and rice, and shredded wheat. Low-sodium crackers. Unsalted rice. Unsalted pasta. Low-sodium bread. Whole-grain breads and whole-grain pasta. Meats and other proteins Fresh or frozen (no salt added) meat, poultry,  seafood, and fish. Low-sodium canned tuna and salmon. Unsalted nuts. Dried peas, beans, and lentils without added salt. Unsalted canned beans. Eggs. Unsalted nut butters. Dairy Milk. Soy milk. Cheese that is naturally low in  sodium, such as ricotta cheese, fresh mozzarella, or Swiss cheese. Low-sodium or reduced-sodium cheese. Cream cheese. Yogurt. Seasonings and condiments Fresh and dried herbs and spices. Salt-free seasonings. Low-sodium mustard and ketchup. Sodium-free salad dressing. Sodium-free light mayonnaise. Fresh or refrigerated horseradish. Lemon juice. Vinegar. Other foods Homemade, reduced-sodium, or low-sodium soups. Unsalted popcorn and pretzels. Low-salt or salt-free chips. The items listed above may not be a complete list of foods and beverages you can eat. Contact a dietitian for more information. What foods should I avoid? Vegetables Sauerkraut, pickled vegetables, and relishes. Olives. [place] fries. Onion rings. Regular canned vegetables (not low-sodium or reduced-sodium). Regular canned tomato sauce and paste (not low-sodium or reduced-sodium). Regular tomato and vegetable juice (not low-sodium or reduced-sodium). Frozen vegetables in sauces. Grains Instant hot cereals. Bread stuffing, pancake, and biscuit mixes. Croutons. Seasoned rice or pasta mixes. Noodle soup cups. Boxed or frozen macaroni and cheese. Regular salted crackers. Self-rising flour. Meats and other proteins Meat or fish that is salted, canned, smoked, spiced, or pickled. Precooked or cured meat, such as sausages or meat loaves. Berniece Salines. Ham. Pepperoni. Hot dogs. Corned beef. Chipped beef. Salt pork. Jerky. Pickled herring. Anchovies and sardines. Regular canned tuna. Salted nuts. Dairy Processed cheese and cheese spreads. Hard cheeses. Cheese curds. Blue cheese. Feta cheese. String cheese. Regular cottage cheese. Buttermilk. Canned milk. Fats and oils Salted butter. Regular margarine. Ghee. Bacon fat. Seasonings and condiments Onion salt, garlic salt, seasoned salt, table salt, and sea salt. Canned and packaged gravies. Worcestershire sauce. Tartar sauce. Barbecue sauce. Teriyaki sauce. Soy sauce, including reduced-sodium. Steak  sauce. Fish sauce. Oyster sauce. Cocktail sauce. Horseradish that you find on the shelf. Regular ketchup and mustard. Meat flavorings and tenderizers. Bouillon cubes. Hot sauce. Pre-made or packaged marinades. Pre-made or packaged taco seasonings. Relishes. Regular salad dressings. Salsa. Other foods Salted popcorn and pretzels. Corn chips and puffs. Potato and tortilla chips. Canned or dried soups. Pizza. Frozen entrees and pot pies. The items listed above may not be a complete list of foods and beverages you should avoid. Contact a dietitian for more information. Summary  Eating less sodium can help lower your blood pressure, reduce swelling, and protect your heart, liver, and kidneys.  Most people on this plan should limit their sodium intake to 1,500-2,000 mg (milligrams) of sodium each day.  Canned, boxed, and frozen foods are high in sodium. Restaurant foods, fast foods, and pizza are also very high in sodium. You also get sodium by adding salt to food.  Try to cook at home, eat more fresh fruits and vegetables, and eat less fast food and canned, processed, or prepared foods. This information is not intended to replace advice given to you by your health care provider. Make sure you discuss any questions you have with your health care provider. Document Revised: 07/31/2019 Document Reviewed: 05/27/2019 Elsevier Patient Education  2021 [employer].

## 2020-11-30 NOTE — Progress Notes (Signed)
Patient Name: Alison Porter. [place]         DOB: 2056/05/26     Height: 5 8    Weight: 181LB  Office Name: [facility] [place] [facility]         Referring Provider: Ermalinda Barrios, PAC   Today's Date: 11/30/20  Date:   STOP BANG RISK ASSESSMENT S (snore) Have you been told that you snore?     YES   T (tired) Are you often tired, fatigued, or sleepy during the day?   NO  O (obstruction) Do you stop breathing, choke, or gasp during sleep? NO   P (pressure) Do you have or are you being treated for high blood pressure? YES   B (BMI) Is your body index greater than 35 kg/m? NO   A (age) Are you 64 years old or older? YES   N (neck) Do you have a neck circumference greater than 16 inches?   NO   G (gender) Are you a female? NO   TOTAL STOP/BANG "YES" ANSWERS 3                                                                       For Office Use Only              Procedure Order Form    YES to 3+ Stop Bang questions OR two clinical symptoms - patient qualifies for WatchPAT (CPT 95800)             Clinical Notes: Will consult Sleep Specialist and refer for management of therapy due to patient increased risk of Sleep Apnea. Ordering a sleep study due to the following two clinical symptoms: Excessive daytime sleepiness G47.10 / Gastroesophageal reflux K21.9 / Nocturia R35.1 / Morning Headaches G44.221 / Difficulty concentrating R41.840 / Memory problems or poor judgment G31.84 / Personality changes or irritability R45.4 / Loud snoring R06.83 / Depression F32.9 / Unrefreshed by sleep G47.8 / Impotence N52.9 / History of high blood pressure R03.0 / Insomnia G47.00    I understand that I am proceeding with a home sleep apnea test as ordered by my treating physician. I understand that untreated sleep apnea is a serious cardiovascular risk factor and it is my responsibility to perform the test and seek management for sleep apnea. I will be contacted with the results and be managed for sleep apnea by a local sleep  physician. I will be receiving equipment and further instructions from [facility]. I shall promptly ship back the equipment via the included mailing label. I understand my insurance will be billed for the test and as the patient I am responsible for any insurance related out-of-pocket costs incurred. I have been provided with written instructions and can call for additional video or telephonic instruction, with 24-hour availability of qualified personnel to answer any questions: Patient Help Desk [phone].  Patient Signature ______________________________________________________   Date______________________ Patient Telemedicine Verbal Consent

## 2020-12-02 ENCOUNTER — Ambulatory Visit: Payer: BC Managed Care – PPO | Admitting: Cardiology

## 2020-12-27 ENCOUNTER — Ambulatory Visit: Payer: BC Managed Care – PPO | Admitting: Physician Assistant

## 2021-03-06 ENCOUNTER — Telehealth: Payer: Self-pay | Admitting: Cardiovascular Disease

## 2021-03-06 NOTE — Telephone Encounter (Signed)
Returned call to Pt.  Pt with recent ER visit for breakthrough afib with elevated heart rates.  Per Pt she had taken her morning diltiazem when she noticed her heart was racing.  Went to ER.  Was given her normal dose metoprolol in ER and heart rate slowed and she went back in rhythm.  Pt agreeable to appointment within the month.  Pt scheduled with Dr. Burt Knack end of September.  Await further needs.

## 2021-03-06 NOTE — Telephone Encounter (Signed)
yesterday pt went to the er for afib.Marland Kitchen would like to know if she should follow up or wait for appt in Feb.. please advise.

## 2021-04-03 ENCOUNTER — Other Ambulatory Visit: Payer: Self-pay

## 2021-04-03 ENCOUNTER — Ambulatory Visit: Payer: BC Managed Care – PPO | Admitting: Cardiovascular Disease

## 2021-04-03 ENCOUNTER — Encounter: Payer: Self-pay | Admitting: Cardiovascular Disease

## 2021-04-03 VITALS — BP 160/70 | HR 64 | Ht 68.0 in | Wt 184.2 lb

## 2021-04-03 DIAGNOSIS — I10 Essential (primary) hypertension: Secondary | ICD-10-CM | POA: Diagnosis not present

## 2021-04-03 DIAGNOSIS — I480 Paroxysmal atrial fibrillation: Secondary | ICD-10-CM

## 2021-04-03 DIAGNOSIS — I472 Ventricular tachycardia, unspecified: Secondary | ICD-10-CM

## 2021-04-03 NOTE — Progress Notes (Signed)
Cardiology Office Note:    Date:  04/03/2021   ID:  Shadell Brenn, DOB [birth year], MRN [id number]  PCP:  Alroy Dust, Carlean Jews.[name], [facility] Cardiologist:  Sherren Mocha, MD    Referring MD: Aurea Graff.[name], MD   Chief Complaint  Patient presents with   Atrial Fibrillation   History of Present Illness:    Alison Porter is a 65 y.o. female with a hx of: Atrial fibrillation: Paroxysmal.   Admitted 6/13 with afib/RVR, back to NSR on diltiazem.   Echo (7/13): EF 55-60%, mild MR, normal RV.   Recurrent atrial fibrillation August 2022, evaluated in the emergency room at [facility], converted spontaneously back to sinus rhythm Ventricular tachycardia:  Suspect RVOT tachycardia.  Noted during ETT in 7/13.   Cardiac MRI (7/13): EF 74%, normal RV size and systolic function with no evidence for ARVC, mild MR, no delayed enhancement.   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  Event Monitor 04/2019: < 1% PVC, PAC burden; 1 run SVT (11 beats) Echo 10/21: EF 60-65, Gr 1 DD, trivial MR Sleep apnea Diabetes mellitus  Hypertension  Hypothyroidism Hyperlipidemia   The patient is here alone today.  She has been followed over the last several years by Richardson Dopp.  This is her first encounter with me.  She was previously followed by Dr. Aundra Dubin.  She has done relatively well with her atrial fibrillation over the years.  Last month she had an episode of heart palpitations and was seen in the emergency room at [facility].  She was found to have atrial fibrillation and converted spontaneously after taking her morning medicines.  She had missed her diltiazem the previous day.  She has tolerated anticoagulation with rivaroxaban without bleeding problems.  She has no history of stroke or TIA.  She has no chest pain, chest pressure, or shortness of breath.  She has been having more problems with  gastroesophageal reflux and the feeling of her food getting stuck when she eats.  She is going to reach out to her primary care physician for guidance on this issue.  She has been taking Pepcid but does not feel like it is helping.  Past Medical History:  Diagnosis Date   Anemia    Anxiety    Anxiety    Atrial fibrillation (HCC)    Heart murmur    Echo 10/21: EF 60-65, no RWMA, GR 1 DD, normal RVSF, RVSP 33.7, trivial MR, borderline dilation of ascending aorta (37 mm)   Hypokalemia    Other and unspecified hyperlipidemia    Type II or unspecified type diabetes mellitus without mention of complication, not stated as uncontrolled    Unspecified essential hypertension    Unspecified hypothyroidism     Past Surgical History:  Procedure Laterality Date   EYE SURGERY Left     Current Medications: Current Meds  Medication Sig   ALPRAZolam (XANAX) 0.25 MG tablet Take 0.25 mg by mouth 3 (three) times daily as needed for anxiety.    chlorthalidone (HYGROTON) 25 MG tablet Take 12.5 mg by mouth as needed.   diltiazem (CARDIZEM CD) 360 MG 24 hr capsule TAKE ONE CAPSULE BY MOUTH ONCE DAILY   famotidine (PEPCID) 20 MG tablet Take by mouth.   ferrous sulfate 325 (65 FE) MG tablet Take 325 mg by mouth 2 (two) times daily.   hydrALAZINE (APRESOLINE) 100 MG tablet Take 100 mg by mouth 2 (  two) times daily.   levothyroxine (SYNTHROID, LEVOTHROID) 50 MCG tablet Take 50 mcg by mouth daily.   metFORMIN (GLUCOPHAGE) 500 MG tablet Take 500 mg by mouth 2 (two) times daily with a meal.    metoprolol (TOPROL-XL) 200 MG 24 hr tablet Take 200 mg by mouth daily.   NOVOLOG MIX 70/30 FLEXPEN (70-30) 100 UNIT/ML Pen Patient inject  35 units in the morning and 25 units in the evening at bedtime   omega-3 acid ethyl esters (LOVAZA) 1 G capsule Take 1 g by mouth daily.   potassium chloride SA (KLOR-CON) 20 MEQ tablet Take 20 mEq by mouth as needed.   pravastatin (PRAVACHOL) 40 MG tablet Take 40 mg by mouth daily.     XARELTO 20 MG TABS tablet TAKE ONE TABLET BY MOUTH ONCE DAILY     Allergies:   Codeine phosphate   Social History   Socioeconomic History   Marital status: Single    Spouse name: Not on file   Number of children: 4   Years of education: Not on file   Highest education level: Not on file  Occupational History   Occupation: retired  Tobacco Use   Smoking status: Former    Years: 15.00    Types: Cigarettes    Quit date: 07/10/1983    Years since quitting: 37.7   Smokeless tobacco: Never  Vaping Use   Vaping Use: Never used  Substance and Sexual Activity   Alcohol use: No   Drug use: No   Sexual activity: Not on file  Other Topics Concern   Not on file  Social History Narrative   Not on file   Social Determinants of Health   Financial Resource Strain: Not on file  Food Insecurity: Not on file  Transportation Needs: Not on file  Physical Activity: Not on file  Stress: Not on file  Social Connections: Not on file     Family History: The patient's family history includes Cancer in her sister; Diabetes in her brother and sister; Hypertension in her mother; Kidney disease in her sister; Stroke in her mother. There is no history of Breast cancer, Stomach cancer, Colon cancer, Pancreatic cancer, or Esophageal cancer.  ROS:   Please see the history of present illness.    All other systems reviewed and are negative.  EKGs/Labs/Other Studies Reviewed:    The following studies were reviewed today: Echo 04-27-2020: IMPRESSIONS     1. Left ventricular ejection fraction, by estimation, is 60 to 65%. The  left ventricle has normal function. The left ventricle has no regional  wall motion abnormalities. Left ventricular diastolic parameters are  consistent with Grade I diastolic  dysfunction (impaired relaxation). Elevated left ventricular end-diastolic  pressure.   2. Right ventricular systolic function is normal. The right ventricular  size is mildly enlarged. There is  normal pulmonary artery systolic  pressure. The estimated right ventricular systolic pressure is 22.9 mmHg.   3. The mitral valve is normal in structure. Trivial mitral valve  regurgitation. No evidence of mitral stenosis.   4. The aortic valve is normal in structure. Aortic valve regurgitation is  not visualized. No aortic stenosis is present.   5. Aortic dilatation noted. There is borderline dilatation of the  ascending aorta, measuring 37 mm.   6. The inferior vena cava is normal in size with greater than 50%  respiratory variability, suggesting right atrial pressure of 3 mmHg.   CT Chest 03/05/2021: FINDINGS: Unremarkable upper abdomen. Normal heart size. No adenopathy.  No pleural or pericardial effusion. There are no right upper lobe nodules. The abnormality on chest x-ray likely represented overlapping structures. Lungs are clear.    IMPRESSION:  1. No pulmonary nodules. The abnormality on chest x-ray likely represent overlapping structures. No acute findings.   EKG:  EKG is ordered today.  The EKG demonstrates normal sinus rhythm 64 bpm, left axis deviation, nonspecific ST abnormality.  Recent Labs: No results found for requested labs within last 8760 hours.  Recent Lipid Panel    Component Value Date/Time   CHOL 175 07/15/2014 1458   TRIG 188.0 (H) 07/15/2014 1458   HDL 28.90 (L) 07/15/2014 1458   CHOLHDL 6 07/15/2014 1458   VLDL 37.6 07/15/2014 1458   LDLCALC 109 (H) 07/15/2014 1458     Risk Assessment/Calculations:    CHA2DS2-VASc Score = 3   This indicates a 3.2% annual risk of stroke. The patient's score is based upon: CHF History: 0 HTN History: 1 Diabetes History: 1 Stroke History: 0 Vascular Disease History: 0 Age Score: 0 Gender Score: 1          Physical Exam:    VS:  BP (!) 160/70   Pulse 64   Ht 5\' 8"  (1.727 m)   Wt 184 lb 3.2 oz (83.6 kg)   LMP 11/10/2012   SpO2 95%   BMI 28.01 kg/m     Wt Readings from Last 3 Encounters:  04/03/21 184  lb 3.2 oz (83.6 kg)  11/30/20 181 lb 3.2 oz (82.2 kg)  05/11/20 184 lb (83.5 kg)     GEN:  Well nourished, well developed in no acute distress HEENT: Normal NECK: No JVD; No carotid bruits LYMPHATICS: No lymphadenopathy CARDIAC: RRR, 2/6 systolic ejection murmur at the right upper sternal border RESPIRATORY:  Clear to auscultation without rales, wheezing or rhonchi  ABDOMEN: Soft, non-tender, non-distended MUSCULOSKELETAL:  No edema; No deformity  SKIN: Warm and dry NEUROLOGIC:  Alert and oriented x 3 PSYCHIATRIC:  Normal affect   ASSESSMENT:    1. PAF (paroxysmal atrial fibrillation) ([place])   2. VT (ventricular tachycardia) ([place])   3. Essential hypertension    PLAN:    In order of problems listed above:  Appears stable.  Tolerating anticoagulation with rivaroxaban.  She will continue diltiazem and metoprolol succinate at current doses.  She is on high doses of both medicines.  She only had 1 recent episode of atrial fibrillation and this was related to missing her diltiazem the previous day.  States that at times she feels like she has "panic attacks."  Otherwise denies any recent symptomatic atrial fibrillation except as above.  I reviewed her most recent echocardiogram which shows normal LV and RV function and normal left and right atrial size.  She has no significant valvular disease.  She will follow-up with Richardson Dopp in 6 months. Reviewed this and her history, cardiac MRI was negative in the past, no recent issues.  Continues on high-dose beta-blocker. Blood pressure is controlled on current medicines.  Reports home readings in the 130s over 60s.  Occasionally has low blood pressures.  Multidrug therapy with chlorthalidone, diltiazem, hydralazine, and metoprolol succinate.  Medication Adjustments/Labs and Tests Ordered: Current medicines are reviewed at length with the patient today.  Concerns regarding medicines are outlined above.  Orders Placed This Encounter  Procedures    EKG 12-Lead    No orders of the defined types were placed in this encounter.   Patient Instructions  Medication Instructions:  No changes *If you  need a refill on your cardiac medications before your next appointment, please call your pharmacy*   Lab Work: none If you have labs (blood work) drawn today and your tests are completely normal, you will receive your results only by: [place] (if you have MyChart) OR A paper copy in the mail If you have any lab test that is abnormal or we need to change your treatment, we will call you to review the results.   Testing/Procedures: none   Follow-Up: At [facility], you and your health needs are our priority.  As part of our continuing mission to provide you with exceptional heart care, we have created designated Provider Care Teams.  These Care Teams include your primary Cardiologist (physician) and Advanced Practice Providers (APPs -  Physician Assistants and Nurse Practitioners) who all work together to provide you with the care you need, when you need it.  Your next appointment:   6 month(s)  The format for your next appointment:   In Person  Provider:   Richardson Dopp, PA-C   Other Instructions     Signed, Sherren Mocha, MD  04/03/2021 12:50 PM    [place]

## 2021-04-03 NOTE — Patient Instructions (Signed)
Medication Instructions:  No changes *If you need a refill on your cardiac medications before your next appointment, please call your pharmacy*   Lab Work: none If you have labs (blood work) drawn today and your tests are completely normal, you will receive your results only by: [place] (if you have MyChart) OR A paper copy in the mail If you have any lab test that is abnormal or we need to change your treatment, we will call you to review the results.   Testing/Procedures: none   Follow-Up: At [facility], you and your health needs are our priority.  As part of our continuing mission to provide you with exceptional heart care, we have created designated Provider Care Teams.  These Care Teams include your primary Cardiologist (physician) and Advanced Practice Providers (APPs -  Physician Assistants and Nurse Practitioners) who all work together to provide you with the care you need, when you need it.  Your next appointment:   6 month(s)  The format for your next appointment:   In Person  Provider:   Richardson Dopp, PA-C   Other Instructions

## 2021-04-19 ENCOUNTER — Other Ambulatory Visit: Payer: Self-pay | Admitting: Family Medicine

## 2021-04-19 DIAGNOSIS — N6320 Unspecified lump in the left breast, unspecified quadrant: Secondary | ICD-10-CM

## 2021-05-24 ENCOUNTER — Other Ambulatory Visit: Payer: BC Managed Care – PPO

## 2021-06-08 ENCOUNTER — Ambulatory Visit
Admission: RE | Admit: 2021-06-08 | Discharge: 2021-06-08 | Disposition: A | Discharge status: Home / Self Care | Payer: BC Managed Care – PPO | Source: Ambulatory Visit | Attending: Family Medicine | Admitting: Family Medicine

## 2021-06-08 ENCOUNTER — Other Ambulatory Visit: Payer: Self-pay

## 2021-06-08 DIAGNOSIS — N6320 Unspecified lump in the left breast, unspecified quadrant: Secondary | ICD-10-CM

## 2021-06-13 ENCOUNTER — Other Ambulatory Visit: Payer: BC Managed Care – PPO

## 2021-06-20 ENCOUNTER — Telehealth: Payer: Self-pay

## 2021-06-20 NOTE — Telephone Encounter (Signed)
No PA required for [facility] Sleep Study per Tillie Rung B with Case Management with [employer] today 06/20/21 at 1232 pm.   Called and made the patient aware that she may proceed with the [facility] Sleep Study. PIN # provided to the patient. Patient made aware that she will be contacted after the test has been read with the results and any recommendations. Patient verbalized understanding. She states that she will complete the test the week of 12/26.

## 2021-07-03 ENCOUNTER — Encounter (INDEPENDENT_AMBULATORY_CARE_PROVIDER_SITE_OTHER): Payer: BC Managed Care – PPO | Admitting: Cardiology

## 2021-07-03 DIAGNOSIS — I480 Paroxysmal atrial fibrillation: Secondary | ICD-10-CM | POA: Diagnosis not present

## 2021-07-07 NOTE — Telephone Encounter (Signed)
Sleep study has been results. Waiting on MD to read study.

## 2021-07-11 ENCOUNTER — Ambulatory Visit: Payer: BC Managed Care – PPO

## 2021-07-11 DIAGNOSIS — G4733 Obstructive sleep apnea (adult) (pediatric): Secondary | ICD-10-CM

## 2021-07-11 NOTE — Procedures (Signed)
° °  Sleep Study Report  Patient Information  Study Date: 07/03/21 Patient Name: Alison Porter Patient ID: [id number] Birth Date: 2055/07/11 Age: 66 Gender: Female BMI: 27.4 (W=181 lb, H=5' 8'') Neck Circ.: 15 '' Referring Physician: Ermalinda Barrios, PA  TEST DESCRIPTION: Home sleep apnea testing was completed using the WatchPat, a Type 1 device, utilizing peripheral arterial tonometry (PAT), chest movement, actigraphy, pulse oximetry, pulse rate, body position and snore. AHI was calculated with apnea and hypopnea using valid sleep time as the denominator. RDI includes apneas, hypopneas, and RERAs. The data acquired and the scoring of sleep and all associated events were performed in accordance with the recommended standards and specifications as outlined in the AASM Manual for the Scoring of Sleep and Associated Events 2.2.0 (2015).  FINDINGS: 1. No evidence of Obstructive Sleep Apnea with AHI 4.1/hr. 2. No Central Sleep Apnea. 3. Oxygen desaturations as low as 86%. 4. Minimal snoring was present. O2 sats were < 88% for 0.9minutes. 5. Total sleep time was 8 hrs and 33 min. 6. 22.6% of total sleep time was spent in REM sleep. 7. Normal sleep onset latency at 18 min. 8. Prolonged REM sleep onset latency at 109 min. 9. Total awakenings were 14.  DIAGNOSIS: Normal study with no significant sleep disordered breathing.  RECOMMENDATIONS" 1. Normal study with no significant sleep disordered breathing.  2. Healthy sleep recommendations include: adequate nightly sleep (normal 7-9 hrs/night), avoidance of caffeine after noon and alcohol near bedtime, and maintaining a sleep environment that is cool, dark and quiet.  3. Weight loss for overweight patients is recommended.  4. Snoring recommendations include: weight loss where appropriate, side sleeping, and avoidance of alcohol before bed.  5. Operation of motor vehicle or dangerous equipment must be avoided when feeling drowsy,  excessively sleepy, or mentally fatigued.  6. An ENT consultation which may be useful for specific causes of and possible treatment of bothersome snoring .  7. Weight loss may be of benefit in reducing the severity of snoring.   Signature: Electronically Signed: 07/11/21 Fransico Him, MD; [facility]; [place], American Board of Sleep Medicine

## 2021-07-12 NOTE — Telephone Encounter (Signed)
Sleep study has been read °

## 2021-08-01 ENCOUNTER — Telehealth: Payer: Self-pay | Admitting: *Deleted

## 2021-08-01 NOTE — Telephone Encounter (Signed)
The patient has been notified of the result and verbalized understanding.  All questions (if any) were answered. Alison Porter, [place] 08/01/2021 6:12 PM    Patient declines further testing and is agreeable to her normal results.

## 2021-08-01 NOTE — Telephone Encounter (Signed)
-----   Message from Sueanne Margarita, MD sent at 07/11/2021  2:27 PM EST ----- Normal home sleep study so in lab PSG will be ordered

## 2021-08-25 ENCOUNTER — Other Ambulatory Visit: Payer: Self-pay | Admitting: Physician Assistant

## 2021-08-25 NOTE — Telephone Encounter (Signed)
Pt's medication was sent to pt's pharmacy as requested. Confirmation received.  °

## 2021-08-30 ENCOUNTER — Ambulatory Visit: Payer: BC Managed Care – PPO | Admitting: Physician Assistant

## 2021-08-30 ENCOUNTER — Encounter: Payer: Self-pay | Admitting: Physician Assistant

## 2021-08-30 ENCOUNTER — Other Ambulatory Visit: Payer: Self-pay

## 2021-08-30 VITALS — BP 160/62 | HR 69 | Ht 68.0 in | Wt 185.8 lb

## 2021-08-30 DIAGNOSIS — I472 Ventricular tachycardia, unspecified: Secondary | ICD-10-CM | POA: Diagnosis not present

## 2021-08-30 DIAGNOSIS — E119 Type 2 diabetes mellitus without complications: Secondary | ICD-10-CM

## 2021-08-30 DIAGNOSIS — I10 Essential (primary) hypertension: Secondary | ICD-10-CM | POA: Diagnosis not present

## 2021-08-30 DIAGNOSIS — Z794 Long term (current) use of insulin: Secondary | ICD-10-CM

## 2021-08-30 DIAGNOSIS — E782 Mixed hyperlipidemia: Secondary | ICD-10-CM | POA: Diagnosis not present

## 2021-08-30 DIAGNOSIS — I480 Paroxysmal atrial fibrillation: Secondary | ICD-10-CM

## 2021-08-30 NOTE — Assessment & Plan Note (Signed)
Blood pressure elevated here.  She typically has high blood pressures in clinic.  Blood pressures at home have typically been optimal.  She has noted her pressures increasing recently.  She started back on chlorthalidone.  Continue chlorthalidone 12.5 mg daily, diltiazem 360 mg daily, hydralazine 100 mg twice daily, metoprolol succinate 20 mg daily, potassium 20 mEq daily.  She will send blood pressure readings through MyChart in 2 weeks.

## 2021-08-30 NOTE — Progress Notes (Signed)
Cardiology Office Note:    Date:  08/30/2021   ID:  Alison Porter, DOB 14-Feb-1956, MRN [id number]  PCP:  Alroy Dust, Carlean Jews.[name], [place] Providers Cardiologist:  Sherren Mocha, MD    Referring MD: Aurea Graff.[name], MD   Chief Complaint:  F/u on AFib    Patient Profile: Atrial fibrillation: Paroxysmal.   Admitted 6/13 with afib/RVR, back to NSR on diltiazem.   Echo (7/13): EF 55-60%, mild MR, normal RV.   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  Noted during ETT in 7/13.   Cardiac MRI (7/13): EF 74%, normal RV size and systolic function with no evidence for ARVC, mild MR, no delayed enhancement.   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  Event Monitor 04/2019: < 1% PVC, PAC burden; 1 run SVT (11 beats) Echo 10/21: EF 60-65, Gr 1 DD, trivial MR Sleep apnea Diabetes mellitus  Hypertension  Hypothyroidism Hyperlipidemia    Prior CV studies: Echocardiogram 04/27/20 EF 60-65, no RWMA, Gr 1 D, normal RVSF, RVSP 33.7, trivial MR, ascending aorta 37 mm   Event monitor 04/2019 1) The basic rhythm is normal sinus with an average HR of 66 bpm 2) There are occasional PVC's and PAC's (<1% burden) 3) There is one 11 beat run of SVT, but no sustained arrhythmia 4. There are no bradycardic events or sustained arrhythmias   Cardiac MRI 03/2012 Impression: 1)    No evidence of RV dysplasia Normal RV size and function 2)    Normal LV EF 74% no hyperenhancemet 3)    Mild LAE 4)    Mild MR   Event Monitor 01/2012 No significant arrhythmias   Myoview 01/2012 Normal nuclear images and EF.  However patient had significant arrhythmia with T wave changes in recovery.  ? RVOT VT Apparently ECG;s showed to Dr Lovena Le and patient allowed to go home.  Discussed with Dr Aundra Dubin who will F/U with patient LV Ejection Fraction: 78%.  LV Wall Motion:  NL LV Function; NL Wall Motion   Echo  01/2012 EF 55-60%, normal wall motion, mild MR, mild LAE, PASP 37 mmHg  History of Present Illness:   Alison Porter is a 66 y.o. female with the above problem list.  She was last seen by Dr. Burt Knack in 9/22.  She had had an episode of atrial fibrillation after missing a dose of diltiazem.  She had return of normal sinus rhythm.  She returns for follow-up.  She is here alone.  She had pityriasis for over a month.  It was stressful.  She had one day when her HR was higher and her BP was up.  Her BP has started to creep up and she started back on chlorthalidone and K+.  She has not had chest pain, shortness of breath, syncope, leg edema.          Past Medical History:  Diagnosis Date   Anemia    Anxiety    Anxiety    Atrial fibrillation (HCC)    Heart murmur    Echo 10/21: EF 60-65, no RWMA, GR 1 DD, normal RVSF, RVSP 33.7, trivial MR, borderline dilation of ascending aorta (37 mm)   Hypokalemia    Other and unspecified hyperlipidemia    Type II or unspecified type diabetes mellitus without mention of complication, not stated as uncontrolled    Unspecified essential hypertension    Unspecified hypothyroidism    Current Medications: Current  Meds  Medication Sig   ALPRAZolam (XANAX) 0.25 MG tablet Take 0.25 mg by mouth 3 (three) times daily as needed for anxiety.    chlorthalidone (HYGROTON) 25 MG tablet Take 1/2 (one-half) tablet by mouth once daily   diltiazem (CARDIZEM CD) 360 MG 24 hr capsule TAKE ONE CAPSULE BY MOUTH ONCE DAILY   famotidine (PEPCID) 20 MG tablet Take 20 mg by mouth as needed for indigestion or heartburn.   ferrous sulfate 325 (65 FE) MG tablet Take 325 mg by mouth daily with breakfast.   hydrALAZINE (APRESOLINE) 100 MG tablet Take 100 mg by mouth 2 (two) times daily.   levothyroxine (SYNTHROID, LEVOTHROID) 50 MCG tablet Take 50 mcg by mouth daily.   metFORMIN (GLUCOPHAGE) 500 MG tablet Take 500 mg by mouth 2 (two) times daily with a meal.    metoprolol (TOPROL-XL)  200 MG 24 hr tablet Take 200 mg by mouth daily.   NOVOLOG MIX 70/30 FLEXPEN (70-30) 100 UNIT/ML Pen Patient inject  35 units in the morning and 25 units in the evening at bedtime   omega-3 acid ethyl esters (LOVAZA) 1 G capsule Take 1 g by mouth daily.   potassium chloride SA (KLOR-CON) 20 MEQ tablet Take 20 mEq by mouth as needed (when taken the fluid pill).   pravastatin (PRAVACHOL) 40 MG tablet Take 40 mg by mouth daily.    XARELTO 20 MG TABS tablet TAKE ONE TABLET BY MOUTH ONCE DAILY    Allergies:   Codeine phosphate and Codeine   Social History   Tobacco Use   Smoking status: Former    Years: 15.00    Types: Cigarettes    Quit date: 07/10/1983    Years since quitting: 38.1   Smokeless tobacco: Never  Vaping Use   Vaping Use: Never used  Substance Use Topics   Alcohol use: No   Drug use: No    Family Hx: The patient's family history includes Cancer in her sister; Diabetes in her brother and sister; Hypertension in her mother; Kidney disease in her sister; Stroke in her mother. There is no history of Breast cancer, Stomach cancer, Colon cancer, Pancreatic cancer, or Esophageal cancer.  Review of Systems  Gastrointestinal:  Negative for hematochezia.  Genitourinary:  Negative for hematuria.    EKGs/Labs/Other Test Reviewed:    EKG:  EKG is not ordered today.  The ekg ordered today demonstrates n/a   Labs from primary care from 09/05/2020 personally reviewed and interpreted Creatinine 0.5, K+ 4, ALT 15, A1c 6.9, TC 141, triglycerides 133, HDL 35, LDL 82, TSH 2.78  Risk Assessment/Calculations:    CHA2DS2-VASc Score = 3   This indicates a 3.2% annual risk of stroke. The patient's score is based upon: CHF History: 0 HTN History: 1 Diabetes History: 1 Stroke History: 0 Vascular Disease History: 0 Age Score: 0 Gender Score: 1        Physical Exam:    VS:  BP (!) 160/62 (BP Location: Left Arm, Patient Position: Sitting, Cuff Size: Normal)    Pulse 69    Ht 5\' 8"   (1.727 m)    Wt 185 lb 12.8 oz (84.3 kg)    LMP 11/10/2012    SpO2 97%    BMI 28.25 kg/m     Wt Readings from Last 3 Encounters:  08/30/21 185 lb 12.8 oz (84.3 kg)  04/03/21 184 lb 3.2 oz (83.6 kg)  11/30/20 181 lb 3.2 oz (82.2 kg)    Constitutional:      Appearance:  Healthy appearance. Not in distress.  Neck:     Vascular: No JVR. JVD normal.  Pulmonary:     Effort: Pulmonary effort is normal.     Breath sounds: No wheezing. No rales.  Cardiovascular:     Normal rate. Regular rhythm. Normal S1. Normal S2.      Murmurs: There is no murmur.  Edema:    Peripheral edema absent.  Abdominal:     Palpations: Abdomen is soft.  Skin:    General: Skin is warm and dry.  Neurological:     Mental Status: Alert and oriented to person, place and time.     Cranial Nerves: Cranial nerves are intact.         ASSESSMENT & PLAN:   PAF (paroxysmal atrial fibrillation) (HCC) Overall, maintaining normal sinus rhythm.  She has had a couple of episodes of atrial fibrillation in the last year.  We discussed obtaining an Apple Watch or Kardia mobile device, if she is interested.  Otherwise, we could place an event monitor on her if she continues to have increasing symptoms.  If she has increasing frequency of symptomatic paroxysmal atrial fibrillation, we will need to consider antiarrhythmic drug therapy.  She has labs pending with primary care in the next couple of weeks.  We will request those to be sent.  Continue rivaroxaban 20 mg daily, metoprolol succinate 200 mg daily, diltiazem 360 mg daily.  VT (ventricular tachycardia) RVOT ventricular tachycardia.  Cardiac MRI in 2013 was negative for ARVC.  Echo in 10/21 with normal EF.  Continue metoprolol succinate 200 mg daily.  HTN (hypertension) Blood pressure elevated here.  She typically has high blood pressures in clinic.  Blood pressures at home have typically been optimal.  She has noted her pressures increasing recently.  She started back on  chlorthalidone.  Continue chlorthalidone 12.5 mg daily, diltiazem 360 mg daily, hydralazine 100 mg twice daily, metoprolol succinate 20 mg daily, potassium 20 mEq daily.  She will send blood pressure readings through MyChart in 2 weeks.  Hyperlipidemia Managed by primary care.  She remains on pravastatin.  She will have fasting labs in 2 weeks.  DM (diabetes mellitus) She remains on NovoLog 70/30 and metformin.  Continue follow-up with primary care.         Dispo:  Return in about 6 months (around 02/27/2022) for Routine follow up in 6 months with Richardson Dopp, PA-C. .   Medication Adjustments/Labs and Tests Ordered: Current medicines are reviewed at length with the patient today.  Concerns regarding medicines are outlined above.  Tests Ordered: No orders of the defined types were placed in this encounter.  Medication Changes: No orders of the defined types were placed in this encounter.  Signed, Richardson Dopp, PA-C  08/30/2021 3:30 PM    [facility] [place], [place], [place]  [address] Phone: [phone]; Fax: [phone]

## 2021-08-30 NOTE — Assessment & Plan Note (Signed)
She remains on NovoLog 70/30 and metformin.  Continue follow-up with primary care.

## 2021-08-30 NOTE — Assessment & Plan Note (Signed)
RVOT ventricular tachycardia.  Cardiac MRI in 2013 was negative for ARVC.  Echo in 10/21 with normal EF.  Continue metoprolol succinate 200 mg daily.

## 2021-08-30 NOTE — Assessment & Plan Note (Signed)
Overall, maintaining normal sinus rhythm.  She has had a couple of episodes of atrial fibrillation in the last year.  We discussed obtaining an Apple Watch or Kardia mobile device, if she is interested.  Otherwise, we could place an event monitor on her if she continues to have increasing symptoms.  If she has increasing frequency of symptomatic paroxysmal atrial fibrillation, we will need to consider antiarrhythmic drug therapy.  She has labs pending with primary care in the next couple of weeks.  We will request those to be sent.  Continue rivaroxaban 20 mg daily, metoprolol succinate 200 mg daily, diltiazem 360 mg daily.

## 2021-08-30 NOTE — Assessment & Plan Note (Signed)
Managed by primary care.  She remains on pravastatin.  She will have fasting labs in 2 weeks.

## 2021-08-30 NOTE — Patient Instructions (Signed)
Medication Instructions:   Your physician recommends that you continue on your current medications as directed. Please refer to the Current Medication list given to you today.   *If you need a refill on your cardiac medications before your next appointment, please call your pharmacy*   Lab Work:  Please have PCP fax labs to [place], [place] @ [phone].  If you have labs (blood work) drawn today and your tests are completely normal, you will receive your results only by: [place] (if you have MyChart) OR A paper copy in the mail If you have any lab test that is abnormal or we need to change your treatment, we will call you to review the results.   Follow-Up: At [facility], you and your health needs are our priority.  As part of our continuing mission to provide you with exceptional heart care, we have created designated Provider Care Teams.  These Care Teams include your primary Cardiologist (physician) and Advanced Practice Providers (APPs -  Physician Assistants and Nurse Practitioners) who all work together to provide you with the care you need, when you need it.  We recommend signing up for the patient portal called "MyChart".  Sign up information is provided on this After Visit Summary.  MyChart is used to connect with patients for Virtual Visits (Telemedicine).  Patients are able to view lab/test results, encounter notes, upcoming appointments, etc.  Non-urgent messages can be sent to your provider as well.   To learn more about what you can do with MyChart, go to [email or web address].    Your next appointment:   6 month(s)  The format for your next appointment:   In Person  Provider:   Richardson Dopp, PA-C         Other Instructions  PLEASE [place]!!  Either phone them in @ [phone] or you can sent readings through mychart.

## 2021-09-01 ENCOUNTER — Ambulatory Visit: Payer: BC Managed Care – PPO | Admitting: Cardiovascular Disease

## 2021-09-12 DIAGNOSIS — I10 Essential (primary) hypertension: Secondary | ICD-10-CM

## 2021-09-12 NOTE — Telephone Encounter (Signed)
Start Chlorthalidone 12.5 mg once daily ?Start K+ 20 mEq once daily  ?BMET 1 week ?Thanks, ?Richardson Dopp, PA-C    ?09/12/2021 5:21 PM   ?

## 2021-09-12 NOTE — Telephone Encounter (Signed)
BP is above goal. ?PLAN:  ?-Increase Chlorthalidone to 25 mg once daily ?-It looks like she is currently taking K+ 20 mEq once daily >> if this is correct, increase it to K+ 20 mEq twice daily.   ?-If she is not currently taking K+ 20 mEq once daily >> start taking K+ 20 mEq once daily  ?-BMET 1 week ?Richardson Dopp, PA-C    ?09/12/2021 5:05 PM   ?

## 2021-09-12 NOTE — Telephone Encounter (Signed)
I spoke with patient.  She has not been taking chlorthalidone daily. States her blood pressure goes down after taking.  Only took one time (2/26) on the days she sent in readings. Also only takes potassium (20 meq) on days she takes chlorthalidone. ?I told patient update would be sent to Richardson Dopp, PA and we would contact her with his recommendations.  ?

## 2021-09-13 MED ORDER — POTASSIUM CHLORIDE CRYS ER 20 MEQ PO TBCR: 20.0000 meq | EXTENDED_RELEASE_TABLET | Freq: Every day | ORAL | 3 refills | Status: DC

## 2021-09-13 MED ORDER — CHLORTHALIDONE 25 MG PO TABS: ORAL_TABLET | ORAL | 3 refills | Status: DC

## 2021-09-13 NOTE — Telephone Encounter (Signed)
Patient notified.  She will stop by office on 09/20/21 for lab work.  Prescription sent to [facility] in [place].  Will also send instructions through my chart.  ?

## 2021-09-18 ENCOUNTER — Telehealth: Payer: Self-pay | Admitting: Physician Assistant

## 2021-09-18 NOTE — Telephone Encounter (Signed)
Spoke with the patient and advised her that I have rescheduled her lab appointment for 3/21.  ?

## 2021-09-18 NOTE — Telephone Encounter (Signed)
Patient requested a telephone note be sent notifying Alison Porter that her labs on 03/15 had to be canceled due to lack of transportation. She will not have transportation until 03/21, but the lab schedule is already full for this day. Advised pt she can go to a [facility] on 03/21, but she is unsure if there is a [facility] in [place]. States she will callback to check for cancellations.  ?

## 2021-09-20 ENCOUNTER — Other Ambulatory Visit: Payer: BC Managed Care – PPO

## 2021-09-26 ENCOUNTER — Other Ambulatory Visit: Payer: BC Managed Care – PPO | Admitting: *Deleted

## 2021-09-26 ENCOUNTER — Other Ambulatory Visit: Payer: Self-pay

## 2021-09-26 DIAGNOSIS — I10 Essential (primary) hypertension: Secondary | ICD-10-CM

## 2021-09-26 LAB — BASIC METABOLIC PANEL
BUN/Creatinine Ratio: 21 (ref 12–28)
BUN: 20 mg/dL (ref 8–27)
CO2: 25 mmol/L (ref 20–29)
Calcium: 10.2 mg/dL (ref 8.7–10.3)
Chloride: 102 mmol/L (ref 96–106)
Creatinine, Ser: 0.96 mg/dL (ref 0.57–1.00)
Glucose: 72 mg/dL (ref 70–99)
Potassium: 4.3 mmol/L (ref 3.5–5.2)
Sodium: 139 mmol/L (ref 134–144)
eGFR: 66 mL/min/{1.73_m2} (ref >59)

## 2022-04-09 NOTE — Progress Notes (Addendum)
Cardiology Office Note:    Date:  04/10/2022   ID:  Alison Porter, DOB 1955/11/25, MRN [id number]  PCP:  Alroy Dust, Carlean Jews.[name], [place] Providers Cardiologist:  Sherren Mocha, MD    Referring MD: Alroy Dust, Carlean Jews.[name], MD   Chief Complaint:  F/u for AFib, RVOT VT    Patient Profile: Atrial fibrillation: Paroxysmal.   Admitted 6/13 with afib/RVR, back to NSR on diltiazem.   Echo (7/13): EF 55-60%, mild MR, normal RV.   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  Noted during ETT in 7/13.   Cardiac MRI (7/13): EF 74%, normal RV size and systolic function with no evidence for ARVC, mild MR, no delayed enhancement.   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  Event Monitor 04/2019: < 1% PVC, PAC burden; 1 run SVT (11 beats) Echo 10/21: EF 60-65, Gr 1 DD, trivial MR Sleep apnea Diabetes mellitus  Hypertension  Hypothyroidism Hyperlipidemia   Prior CV Studies:   Echocardiogram 04/27/20 EF 60-65, no RWMA, Gr 1 D, normal RVSF, RVSP 33.7, trivial MR, ascending aorta 37 mm   Event monitor 04/2019 1) The basic rhythm is normal sinus with an average HR of 66 bpm 2) There are occasional PVC's and PAC's (<1% burden) 3) There is one 11 beat run of SVT, but no sustained arrhythmia 4. There are no bradycardic events or sustained arrhythmias   Cardiac MRI 03/2012 Impression: 1)    No evidence of RV dysplasia Normal RV size and function 2)    Normal LV EF 74% no hyperenhancemet 3)    Mild LAE 4)    Mild MR   Event Monitor 01/2012 No significant arrhythmias   Myoview 01/2012 Normal nuclear images and EF.  However patient had significant arrhythmia with T wave changes in recovery.  ? RVOT VT Apparently ECG;s showed to Dr Lovena Le and patient allowed to go home.  Discussed with Dr Aundra Dubin who will F/U with patient LV Ejection Fraction: 78%.  LV Wall Motion:  NL LV Function; NL Wall  Motion   Echo 01/2012 EF 55-60%, normal wall motion, mild MR, mild LAE, PASP 37 mmHg  History of Present Illness:   Alison Porter is a 66 y.o. female with the above problem list.  She was last seen in February 2023.  She returns for follow-up. She is here alone. She is helping care for her 20 year old granddaughter. This is stressful for her.  She notes a lot of skipped beats at least once a week. She has not had sustained or rapid palpitations. She has not had symptoms like she had with atrial fibrillation. She has not had chest pain, shortness of breath, syncope, orthopnea. She has dependent edema that resolves with elevation.         Past Medical History:  Diagnosis Date   Anemia    Anxiety    Anxiety    Atrial fibrillation (HCC)    Heart murmur    Echo 10/21: EF 60-65, no RWMA, GR 1 DD, normal RVSF, RVSP 33.7, trivial MR, borderline dilation of ascending aorta (37 mm)   Hypokalemia    Other and unspecified hyperlipidemia    Type II or unspecified type diabetes mellitus without mention of complication, not stated as uncontrolled    Unspecified essential hypertension    Unspecified hypothyroidism    Current Medications: Current Meds  Medication Sig   ALPRAZolam (XANAX) 0.25 MG tablet Take 0.25  mg by mouth 3 (three) times daily as needed for anxiety.    chlorthalidone (HYGROTON) 25 MG tablet Take 1/2 (one-half) tablet by mouth once daily   diltiazem (CARDIZEM CD) 360 MG 24 hr capsule TAKE ONE CAPSULE BY MOUTH ONCE DAILY   famotidine (PEPCID) 20 MG tablet Take 20 mg by mouth as needed for indigestion or heartburn.   ferrous sulfate 325 (65 FE) MG tablet Take 325 mg by mouth daily with breakfast.   hydrALAZINE (APRESOLINE) 100 MG tablet Take 100 mg by mouth 2 (two) times daily.   levothyroxine (SYNTHROID, LEVOTHROID) 50 MCG tablet Take 50 mcg by mouth daily.   metFORMIN (GLUCOPHAGE) 500 MG tablet Take 500 mg by mouth 2 (two) times daily with a meal.    metoprolol (TOPROL-XL) 200 MG  24 hr tablet Take 200 mg by mouth daily.   NOVOLOG MIX 70/30 FLEXPEN (70-30) 100 UNIT/ML Pen Patient inject  35 units in the morning and 25 units in the evening at bedtime   omega-3 acid ethyl esters (LOVAZA) 1 G capsule Take 1 g by mouth daily.   potassium chloride SA (KLOR-CON M) 20 MEQ tablet Take 1 tablet (20 mEq total) by mouth daily.   pravastatin (PRAVACHOL) 40 MG tablet Take 40 mg by mouth daily.    XARELTO 20 MG TABS tablet TAKE ONE TABLET BY MOUTH ONCE DAILY    Allergies:   Codeine phosphate and Codeine   Social History   Tobacco Use   Smoking status: Former    Years: 15.00    Types: Cigarettes    Quit date: 07/10/1983    Years since quitting: 38.7   Smokeless tobacco: Never  Vaping Use   Vaping Use: Never used  Substance Use Topics   Alcohol use: No   Drug use: No    Family Hx: The patient's family history includes Cancer in her sister; Diabetes in her brother and sister; Hypertension in her mother; Kidney disease in her sister; Stroke in her mother. There is no history of Breast cancer, Stomach cancer, Colon cancer, Pancreatic cancer, or Esophageal cancer.  Review of Systems  Gastrointestinal:  Negative for hematochezia.  Genitourinary:  Negative for hematuria.     EKGs/Labs/Other Test Reviewed:    EKG:  EKG is   ordered today.  The ekg ordered today demonstrates NSR, HR 72, LAD, non-specific ST-TW changes, QTc 451 ms   Recent Labs: 09/26/2021: BUN 20; Creatinine, Ser 0.96; Potassium 4.3; Sodium 139   Recent Lipid Panel No results for input(s): "CHOL", "TRIG", "HDL", "VLDL", "LDLCALC", "LDLDIRECT" in the last 8760 hours.   Risk Assessment/Calculations/Metrics:    CHA2DS2-VASc Score = 3   This indicates a 3.2% annual risk of stroke. The patient's score is based upon: CHF History: 0 HTN History: 1 Diabetes History: 1 Stroke History: 0 Vascular Disease History: 0 Age Score: 0 Gender Score: 1             Physical Exam:    VS:  BP (!) 136/50   Pulse  72   Ht '5\' 8"'$  (1.727 m)   Wt 182 lb (82.6 kg)   LMP 11/10/2012   SpO2 97%   BMI 27.67 kg/m     Wt Readings from Last 3 Encounters:  04/10/22 182 lb (82.6 kg)  08/30/21 185 lb 12.8 oz (84.3 kg)  04/03/21 184 lb 3.2 oz (83.6 kg)    Constitutional:      Appearance: Healthy appearance. Not in distress.  Neck:     Vascular: No JVR.  JVD normal.  Pulmonary:     Effort: Pulmonary effort is normal.     Breath sounds: No wheezing. No rales.  Cardiovascular:     Normal rate. Regular rhythm. Normal S1. Normal S2.      Murmurs: There is a grade 2/6 systolic murmur at the URSB, radiating to the apex.  Edema:    Peripheral edema absent.  Abdominal:     Palpations: Abdomen is soft.  Skin:    General: Skin is warm and dry.  Neurological:     Mental Status: Alert and oriented to person, place and time.         ASSESSMENT & PLAN:   PAF (paroxysmal atrial fibrillation) (HCC) Maintaining NSR. She is tolerating anticoagulation. CrCl 78 mL/min. Continue Xarelto 20 mg once daily. I will request most recent BMET and CBC from her PCP. F/u in 6 mos.   VT (ventricular tachycardia) RVOT ventricular tachycardia. MRI in the past w/o evidence of ARVC. She has not had syncope. She has occasional palpitations that sound c/w PVCs vs PACs. She has not had sustained rapid palpitations. Continue Metoprolol succinate 200 mg once daily, Diltiazem 360 mg once daily.  Murmur She has a fairly prominent systolic murmur on exam as outlined. She has had trivial MR noted on prior echocardiogram. Last echocardiogram was in 2021. She is not having any symptoms to suggest severe valvular heart disease.  Arrange 2-D Echocardiogram   HTN (hypertension) Borderline control. She is on a high dose of several medications for BP. She does note she probably eats a lot of salt and does not exercise regularly. She is helping take care of her 78 year old granddaughter which is creating a lot of stress. Continue chlorthalidone 12.5  mg once daily, diltiazem 360 mg once daily, hydralazine 100 mg twice daily, metoprolol succinate 200 mg once daily. We discussed increasing activity with daily walking. I have also given her information for a low sodium diet. If BP remains high, we can consider increasing Hydralazine to three times a day or increasing chlorthalidone to 25 mg once daily.   Hyperlipidemia Managed by primary care.            Dispo:  Return in about 6 months (around 10/10/2022) for Routine Follow Up, w/ Dr. Burt Knack, or Richardson Dopp, PA-C.   Medication Adjustments/Labs and Tests Ordered: Current medicines are reviewed at length with the patient today.  Concerns regarding medicines are outlined above.  Tests Ordered: Orders Placed This Encounter  Procedures   EKG 12-Lead   ECHOCARDIOGRAM COMPLETE   Medication Changes: No orders of the defined types were placed in this encounter.  Signed, Richardson Dopp, PA-C  04/10/2022 12:05 PM    [facility], [place], [place]  [address] Phone: [phone]; Fax: [phone]

## 2022-04-10 ENCOUNTER — Encounter: Payer: Self-pay | Admitting: Physician Assistant

## 2022-04-10 ENCOUNTER — Ambulatory Visit: Payer: Medicare PPO | Attending: Physician Assistant | Admitting: Physician Assistant

## 2022-04-10 VITALS — BP 136/50 | HR 72 | Ht 68.0 in | Wt 182.0 lb

## 2022-04-10 DIAGNOSIS — I472 Ventricular tachycardia, unspecified: Secondary | ICD-10-CM

## 2022-04-10 DIAGNOSIS — I10 Essential (primary) hypertension: Secondary | ICD-10-CM

## 2022-04-10 DIAGNOSIS — R011 Cardiac murmur, unspecified: Secondary | ICD-10-CM | POA: Diagnosis not present

## 2022-04-10 DIAGNOSIS — I480 Paroxysmal atrial fibrillation: Secondary | ICD-10-CM | POA: Diagnosis not present

## 2022-04-10 DIAGNOSIS — E782 Mixed hyperlipidemia: Secondary | ICD-10-CM

## 2022-04-10 NOTE — Assessment & Plan Note (Signed)
Maintaining NSR. She is tolerating anticoagulation. CrCl 78 mL/min. Continue Xarelto 20 mg once daily. I will request most recent BMET and CBC from her PCP. F/u in 6 mos.

## 2022-04-10 NOTE — Assessment & Plan Note (Signed)
Managed by primary care. 

## 2022-04-10 NOTE — Assessment & Plan Note (Signed)
RVOT ventricular tachycardia. MRI in the past w/o evidence of ARVC. She has not had syncope. She has occasional palpitations that sound c/w PVCs vs PACs. She has not had sustained rapid palpitations. Continue Metoprolol succinate 200 mg once daily, Diltiazem 360 mg once daily.

## 2022-04-10 NOTE — Patient Instructions (Signed)
Medication Instructions:  Your physician recommends that you continue on your current medications as directed. Please refer to the Current Medication list given to you today.  *If you need a refill on your cardiac medications before your next appointment, please call your pharmacy*   Lab Work: None ordered  If you have labs (blood work) drawn today and your tests are completely normal, you will receive your results only by: [place] (if you have MyChart) OR A paper copy in the mail If you have any lab test that is abnormal or we need to change your treatment, we will call you to review the results.   Testing/Procedures: Your physician has requested that you have an echocardiogram. Echocardiography is a painless test that uses sound waves to create images of your heart. It provides your doctor with information about the size and shape of your heart and how well your heart's chambers and valves are working. This procedure takes approximately one hour. There are no restrictions for this procedure.    Follow-Up: At [facility], you and your health needs are our priority.  As part of our continuing mission to provide you with exceptional heart care, we have created designated Provider Care Teams.  These Care Teams include your primary Cardiologist (physician) and Advanced Practice Providers (APPs -  Physician Assistants and Nurse Practitioners) who all work together to provide you with the care you need, when you need it.  We recommend signing up for the patient portal called "MyChart".  Sign up information is provided on this After Visit Summary.  MyChart is used to connect with patients for Virtual Visits (Telemedicine).  Patients are able to view lab/test results, encounter notes, upcoming appointments, etc.  Non-urgent messages can be sent to your provider as well.   To learn more about what you can do with MyChart, go to [email or web address].    Your next appointment:    6 month(s)  The format for your next appointment:   In Person  Provider:   Sherren Mocha, MD  or Richardson Dopp, PA-C         Other Instructions Low-Sodium Eating Plan Sodium, which is an element that makes up salt, helps you maintain a healthy balance of fluids in your body. Too much sodium can increase your blood pressure and cause fluid and waste to be held in your body. Your health care provider or dietitian may recommend following this plan if you have high blood pressure (hypertension), kidney disease, liver disease, or heart failure. Eating less sodium can help lower your blood pressure, reduce swelling, and protect your heart, liver, and kidneys. What are tips for following this plan? Reading food labels The Nutrition Facts label lists the amount of sodium in one serving of the food. If you eat more than one serving, you must multiply the listed amount of sodium by the number of servings. Choose foods with less than 140 mg of sodium per serving. Avoid foods with 300 mg of sodium or more per serving. Shopping  Look for lower-sodium products, often labeled as "low-sodium" or "no salt added." Always check the sodium content, even if foods are labeled as "unsalted" or "no salt added." Buy fresh foods. Avoid canned foods and pre-made or frozen meals. Avoid canned, cured, or processed meats. Buy breads that have less than 80 mg of sodium per slice. Cooking  Eat more home-cooked food and less restaurant, buffet, and fast food. Avoid adding salt when cooking. Use salt-free seasonings or herbs instead of  table salt or sea salt. Check with your health care provider or pharmacist before using salt substitutes. Cook with plant-based oils, such as canola, sunflower, or olive oil. Meal planning When eating at a restaurant, ask that your food be prepared with less salt or no salt, if possible. Avoid dishes labeled as brined, pickled, cured, smoked, or made with soy sauce, miso, or teriyaki  sauce. Avoid foods that contain MSG (monosodium glutamate). MSG is sometimes added to [place] food, bouillon, and some canned foods. Make meals that can be grilled, baked, poached, roasted, or steamed. These are generally made with less sodium. General information Most people on this plan should limit their sodium intake to 1,500-2,000 mg (milligrams) of sodium each day. What foods should I eat? Fruits Fresh, frozen, or canned fruit. Fruit juice. Vegetables Fresh or frozen vegetables. "No salt added" canned vegetables. "No salt added" tomato sauce and paste. Low-sodium or reduced-sodium tomato and vegetable juice. Grains Low-sodium cereals, including oats, puffed wheat and rice, and shredded wheat. Low-sodium crackers. Unsalted rice. Unsalted pasta. Low-sodium bread. Whole-grain breads and whole-grain pasta. Meats and other proteins Fresh or frozen (no salt added) meat, poultry, seafood, and fish. Low-sodium canned tuna and salmon. Unsalted nuts. Dried peas, beans, and lentils without added salt. Unsalted canned beans. Eggs. Unsalted nut butters. Dairy Milk. Soy milk. Cheese that is naturally low in sodium, such as ricotta cheese, fresh mozzarella, or Swiss cheese. Low-sodium or reduced-sodium cheese. Cream cheese. Yogurt. Seasonings and condiments Fresh and dried herbs and spices. Salt-free seasonings. Low-sodium mustard and ketchup. Sodium-free salad dressing. Sodium-free light mayonnaise. Fresh or refrigerated horseradish. Lemon juice. Vinegar. Other foods Homemade, reduced-sodium, or low-sodium soups. Unsalted popcorn and pretzels. Low-salt or salt-free chips. The items listed above may not be a complete list of foods and beverages you can eat. Contact a dietitian for more information. What foods should I avoid? Vegetables Sauerkraut, pickled vegetables, and relishes. Olives. [place] fries. Onion rings. Regular canned vegetables (not low-sodium or reduced-sodium). Regular canned tomato  sauce and paste (not low-sodium or reduced-sodium). Regular tomato and vegetable juice (not low-sodium or reduced-sodium). Frozen vegetables in sauces. Grains Instant hot cereals. Bread stuffing, pancake, and biscuit mixes. Croutons. Seasoned rice or pasta mixes. Noodle soup cups. Boxed or frozen macaroni and cheese. Regular salted crackers. Self-rising flour. Meats and other proteins Meat or fish that is salted, canned, smoked, spiced, or pickled. Precooked or cured meat, such as sausages or meat loaves. Berniece Salines. Ham. Pepperoni. Hot dogs. Corned beef. Chipped beef. Salt pork. Jerky. Pickled herring. Anchovies and sardines. Regular canned tuna. Salted nuts. Dairy Processed cheese and cheese spreads. Hard cheeses. Cheese curds. Blue cheese. Feta cheese. String cheese. Regular cottage cheese. Buttermilk. Canned milk. Fats and oils Salted butter. Regular margarine. Ghee. Bacon fat. Seasonings and condiments Onion salt, garlic salt, seasoned salt, table salt, and sea salt. Canned and packaged gravies. Worcestershire sauce. Tartar sauce. Barbecue sauce. Teriyaki sauce. Soy sauce, including reduced-sodium. Steak sauce. Fish sauce. Oyster sauce. Cocktail sauce. Horseradish that you find on the shelf. Regular ketchup and mustard. Meat flavorings and tenderizers. Bouillon cubes. Hot sauce. Pre-made or packaged marinades. Pre-made or packaged taco seasonings. Relishes. Regular salad dressings. Salsa. Other foods Salted popcorn and pretzels. Corn chips and puffs. Potato and tortilla chips. Canned or dried soups. Pizza. Frozen entrees and pot pies. The items listed above may not be a complete list of foods and beverages you should avoid. Contact a dietitian for more information. Summary Eating less sodium can help lower your blood pressure,  reduce swelling, and protect your heart, liver, and kidneys. Most people on this plan should limit their sodium intake to 1,500-2,000 mg (milligrams) of sodium each  day. Canned, boxed, and frozen foods are high in sodium. Restaurant foods, fast foods, and pizza are also very high in sodium. You also get sodium by adding salt to food. Try to cook at home, eat more fresh fruits and vegetables, and eat less fast food and canned, processed, or prepared foods. This information is not intended to replace advice given to you by your health care provider. Make sure you discuss any questions you have with your health care provider. Document Revised: 07/31/2019 Document Reviewed: 05/27/2019 Elsevier Patient Education  [place]

## 2022-04-10 NOTE — Assessment & Plan Note (Signed)
She has a fairly prominent systolic murmur on exam as outlined. She has had trivial MR noted on prior echocardiogram. Last echocardiogram was in 2021. She is not having any symptoms to suggest severe valvular heart disease.  Arrange 2-D Echocardiogram

## 2022-04-10 NOTE — Assessment & Plan Note (Signed)
Borderline control. She is on a high dose of several medications for BP. She does note she probably eats a lot of salt and does not exercise regularly. She is helping take care of her 65 year old granddaughter which is creating a lot of stress. Continue chlorthalidone 12.5 mg once daily, diltiazem 360 mg once daily, hydralazine 100 mg twice daily, metoprolol succinate 200 mg once daily. We discussed increasing activity with daily walking. I have also given her information for a low sodium diet. If BP remains high, we can consider increasing Hydralazine to three times a day or increasing chlorthalidone to 25 mg once daily.

## 2022-04-17 DIAGNOSIS — R002 Palpitations: Secondary | ICD-10-CM

## 2022-04-18 ENCOUNTER — Ambulatory Visit: Payer: Medicare PPO | Attending: Physician Assistant

## 2022-04-18 DIAGNOSIS — R002 Palpitations: Secondary | ICD-10-CM

## 2022-04-18 NOTE — Progress Notes (Unsigned)
Enrolled for [employer] to mail a ZIO XT Domingo term holter monitor to the patients address on file.   [id number] mailed to patient  on 10/11/2023m and applied in office on 04/23/22.  Dr. Burt Knack to read.

## 2022-04-18 NOTE — Telephone Encounter (Signed)
Let's set up a 14 day Zio XT to evaluate. Richardson Dopp, PA-C    04/18/2022 10:08 AM

## 2022-04-23 ENCOUNTER — Ambulatory Visit (HOSPITAL_COMMUNITY): Payer: Medicare PPO | Attending: Physician Assistant

## 2022-04-23 DIAGNOSIS — R011 Cardiac murmur, unspecified: Secondary | ICD-10-CM | POA: Insufficient documentation

## 2022-04-23 DIAGNOSIS — R002 Palpitations: Secondary | ICD-10-CM

## 2022-04-23 DIAGNOSIS — I480 Paroxysmal atrial fibrillation: Secondary | ICD-10-CM | POA: Insufficient documentation

## 2022-04-23 DIAGNOSIS — I472 Ventricular tachycardia, unspecified: Secondary | ICD-10-CM | POA: Diagnosis not present

## 2022-04-23 LAB — ECHOCARDIOGRAM COMPLETE
Area-P 1/2: 3.28 cm2
S' Lateral: 3 cm

## 2022-04-24 ENCOUNTER — Encounter: Payer: Self-pay | Admitting: Physician Assistant

## 2022-04-24 DIAGNOSIS — I340 Nonrheumatic mitral (valve) insufficiency: Secondary | ICD-10-CM | POA: Insufficient documentation

## 2022-04-24 HISTORY — DX: Nonrheumatic mitral (valve) insufficiency: I34.0

## 2022-04-30 ENCOUNTER — Other Ambulatory Visit: Payer: Self-pay | Admitting: Family Medicine

## 2022-04-30 DIAGNOSIS — Z1231 Encounter for screening mammogram for malignant neoplasm of breast: Secondary | ICD-10-CM

## 2022-05-14 MED ORDER — PROPRANOLOL HCL 10 MG PO TABS: 10.0000 mg | ORAL_TABLET | Freq: Every day | ORAL | 2 refills | Status: DC | PRN

## 2022-05-14 NOTE — Addendum Note (Signed)
Addended byKathlen Mody, Nicki Reaper T on: 05/14/2022 12:57 PM   Modules accepted: Orders

## 2022-06-21 ENCOUNTER — Ambulatory Visit: Payer: Medicare PPO

## 2022-06-22 ENCOUNTER — Ambulatory Visit
Admission: RE | Admit: 2022-06-22 | Discharge: 2022-06-22 | Disposition: A | Discharge status: Home / Self Care | Payer: Medicare PPO | Source: Ambulatory Visit | Attending: Family Medicine | Admitting: Family Medicine

## 2022-06-22 DIAGNOSIS — Z1231 Encounter for screening mammogram for malignant neoplasm of breast: Secondary | ICD-10-CM

## 2022-10-02 ENCOUNTER — Other Ambulatory Visit: Payer: Self-pay | Admitting: Interventional Cardiology

## 2022-10-14 NOTE — Progress Notes (Unsigned)
Cardiology Office Note:    Date:  10/15/2022  ID:  Alison Porter, DOB 04/02/1956, MRN [id number] [facility] Providers Cardiologist:  Tonny Bollman, MD      Patient Profile:   Atrial fibrillation: Paroxysmal.   Admitted 6/13 with afib/RVR, back to [facility] on diltiazem.   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  Noted during ETT in 7/13.   Cardiac MRI (7/13): EF 74%, normal RV size and systolic function with no evidence for ARVC, mild MR, no delayed enhancement.   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  Event Monitor 04/2019: < 1% PVC, PAC burden; 1 run SVT (11 beats) Echo 04/27/20: EF 60-65, no RWMA, Gr 1 D, normal RVSF, RVSP 33.7, trivial MR, ascending aorta 37 mm  TTE 04/23/2022: EF 55-60, no RWMA, normal RVSF, normal PASP, mild LAE, mild MR, RAP 3 Monitor 04/2022: NSR, Avg 62, short SVT runs (longest 17 beats), no sustained arrhythmias, no A-fib or flutter Mild mitral regurgitation Sleep apnea Diabetes mellitus  Hypertension  Hypothyroidism Hyperlipidemia     History of Present Illness:   Alison Porter is a 67 y.o. female who returns for follow-up on atrial fibrillation, ventricular tachycardia.  She was last seen 04/10/2022.  She noted palpitations.  She also had a murmur on exam.  A follow-up echo demonstrated normal EF, mild mitral regurgitation.  She continued having palpitations and we ultimately set her up for an event monitor.  This demonstrated mostly normal sinus rhythm.  She had a few short runs of supraventricular tachycardia with the longest being 17 beats.  She was given a prescription for as needed propranolol.  She is here alone.  She notes she had 2 episodes of palpitations recently.  These were rapid and lasted about an hour.  Unfortunately, the propranolol was not called into her pharmacy and she has not been able to take that.  She has not had chest pain,  significant shortness of breath.  She has not had syncope, near syncope.  She has not had orthopnea.  She does have dependent pedal edema that resolves with elevation.  She has noted some heart rates in the upper 40s at night on her blood pressure cuff.  She is asymptomatic with this.  Review of Systems  Gastrointestinal:  Negative for hematochezia and melena.  Genitourinary:  Negative for hematuria.       Studies Reviewed:    EKG:  not done   Risk Assessment/Calculations:    CHA2DS2-VASc Score = 4   This indicates a 4.8% annual risk of stroke. The patient's score is based upon: CHF History: 0 HTN History: 1 Diabetes History: 1 Stroke History: 0 Vascular Disease History: 0 Age Score: 1 Gender Score: 1    HYPERTENSION CONTROL Vitals:   10/15/22 1318 10/15/22 1357  BP: (!) 166/60 (!) 174/80    The patient's blood pressure is elevated above target today.  In order to address the patient's elevated BP: A current anti-hypertensive medication was adjusted today.          Physical Exam:   VS:  BP (!) 174/80   Pulse 65   Ht 5' 7.5" (1.715 m)   Wt 184 lb (83.5 kg)   LMP 11/10/2012   SpO2 97%   BMI 28.39 kg/m    Wt Readings from Last 3 Encounters:  10/15/22 184 lb (83.5 kg)  04/10/22 182 lb (82.6 kg)  08/30/21 185 lb 12.8 oz (  84.3 kg)    Constitutional:      Appearance: Healthy appearance. Not in distress.  Neck:     Vascular: JVD normal.  Pulmonary:     Breath sounds: Normal breath sounds. No wheezing. No rales.  Cardiovascular:     Normal rate. Regular rhythm. Normal S1. Normal S2.      Murmurs: There is a grade 1/6 systolic murmur at the URSB.  Edema:    Peripheral edema absent.  Abdominal:     Palpations: Abdomen is soft.       ASSESSMENT AND PLAN:   PAF (paroxysmal atrial fibrillation) (HCC) She had 2 recent episodes of what was probably atrial fibrillation. They lasted about an hour. Monitor in 04/2022 was neg for AFib. If she has more frequent  episodes of atrial fibrillation, consider referral back to EP for consideration of AAD vs Ablation.  Labs from [facility] reviewed.  Creatinine in February 2024 was 1.04.  Creatinine clearance is 71.  Continue current dose of Xarelto 20 mg daily, Cardizem 360 mg daily, Toprol-XL 200 mg daily.  Continue propranolol 10 mg as needed for palpitations.  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 MRI in the past w/o evidence of ARVC. She has not had syncope. Continue Metoprolol succinate 200 mg once daily, Diltiazem 360 mg once daily.  HTN (hypertension) Blood pressure is uncontrolled.  She notes that she does eat a lot of salt.  She would like to avoid adding another medication.  I reviewed her chart.  She was placed on candesartan back in 2020.  I cannot find why it was stopped.  We discussed lifestyle modifications to include increased exercise, weight loss and reduced salt intake.  Increase hydralazine to 100 mg 3 times a day.  Continue chlorthalidone 12.5 mg daily, Cardizem 360 mg daily, Toprol-XL 200 mg daily.  Follow-up in 3 months.  If her blood pressure remains elevated, consider adding valsartan versus increasing chlorthalidone dose.        Dispo:  Return in about 3 months (around 01/14/2023) for Routine Follow Up, w/ Tereso Newcomer, PA-C.  Signed, Tereso Newcomer, PA-C

## 2022-10-15 ENCOUNTER — Ambulatory Visit: Payer: Medicare PPO | Attending: Physician Assistant | Admitting: Physician Assistant

## 2022-10-15 ENCOUNTER — Encounter: Payer: Self-pay | Admitting: Physician Assistant

## 2022-10-15 VITALS — BP 174/80 | HR 65 | Ht 67.5 in | Wt 184.0 lb

## 2022-10-15 DIAGNOSIS — I10 Essential (primary) hypertension: Secondary | ICD-10-CM

## 2022-10-15 DIAGNOSIS — I480 Paroxysmal atrial fibrillation: Secondary | ICD-10-CM | POA: Diagnosis not present

## 2022-10-15 DIAGNOSIS — Z1211 Encounter for screening for malignant neoplasm of colon: Secondary | ICD-10-CM | POA: Diagnosis not present

## 2022-10-15 DIAGNOSIS — I340 Nonrheumatic mitral (valve) insufficiency: Secondary | ICD-10-CM

## 2022-10-15 DIAGNOSIS — I472 Ventricular tachycardia, unspecified: Secondary | ICD-10-CM

## 2022-10-15 MED ORDER — PROPRANOLOL HCL 10 MG PO TABS: 10.0000 mg | ORAL_TABLET | Freq: Every day | ORAL | 1 refills | Status: DC | PRN

## 2022-10-15 MED ORDER — HYDRALAZINE HCL 100 MG PO TABS: 100.0000 mg | ORAL_TABLET | Freq: Three times a day (TID) | ORAL | 3 refills | Status: DC

## 2022-10-15 NOTE — Assessment & Plan Note (Signed)
Blood pressure is uncontrolled.  She notes that she does eat a lot of salt.  She would like to avoid adding another medication.  I reviewed her chart.  She was placed on candesartan back in 2020.  I cannot find why it was stopped.  We discussed lifestyle modifications to include increased exercise, weight loss and reduced salt intake.  Increase hydralazine to 100 mg 3 times a day.  Continue chlorthalidone 12.5 mg daily, Cardizem 360 mg daily, Toprol-XL 200 mg daily.  Follow-up in 3 months.  If her blood pressure remains elevated, consider adding valsartan versus increasing chlorthalidone dose.

## 2022-10-15 NOTE — Patient Instructions (Signed)
Medication Instructions:  Your physician has recommended you make the following change in your medication:   INCREASE the Hydralazine to three times a day   *If you need a refill on your cardiac medications before your next appointment, please call your pharmacy*   Lab Work: None ordered  If you have labs (blood work) drawn today and your tests are completely normal, you will receive your results only by: MyChart Message (if you have MyChart) OR A paper copy in the mail If you have any lab test that is abnormal or we need to change your treatment, we will call you to review the results.   Testing/Procedures: None ordered   Follow-Up: At [facility], you and your health needs are our priority.  As part of our continuing mission to provide you with exceptional heart care, we have created designated Provider Care Teams.  These Care Teams include your primary Cardiologist (physician) and Advanced Practice Providers (APPs -  Physician Assistants and Nurse Practitioners) who all work together to provide you with the care you need, when you need it.  We recommend signing up for the patient portal called "MyChart".  Sign up information is provided on this After Visit Summary.  MyChart is used to connect with patients for Virtual Visits (Telemedicine).  Patients are able to view lab/test results, encounter notes, upcoming appointments, etc.  Non-urgent messages can be sent to your provider as well.   To learn more about what you can do with MyChart, go to [email or web address].    Your next appointment:    01/14/23 arrive at 1:15  Provider:   Tereso Newcomer, PA-C         Other Instructions

## 2022-10-15 NOTE — Assessment & Plan Note (Signed)
RVOT ventricular tachycardia. MRI in the past w/o evidence of ARVC. She has not had syncope. Continue Metoprolol succinate 200 mg once daily, Diltiazem 360 mg once daily.

## 2022-10-15 NOTE — Assessment & Plan Note (Signed)
She had 2 recent episodes of what was probably atrial fibrillation. They lasted about an hour. Monitor in 04/2022 was neg for AFib. If she has more frequent episodes of atrial fibrillation, consider referral back to EP for consideration of AAD vs Ablation.  Labs from [facility] reviewed.  Creatinine in February 2024 was 1.04.  Creatinine clearance is 71.  Continue current dose of Xarelto 20 mg daily, Cardizem 360 mg daily, Toprol-XL 200 mg daily.  Continue propranolol 10 mg as needed for palpitations.

## 2023-01-15 ENCOUNTER — Encounter: Payer: Self-pay | Admitting: Physician Assistant

## 2023-01-15 ENCOUNTER — Ambulatory Visit: Payer: Medicare PPO | Attending: Physician Assistant | Admitting: Physician Assistant

## 2023-01-15 VITALS — BP 120/60 | HR 56 | Ht 67.0 in | Wt 182.8 lb

## 2023-01-15 DIAGNOSIS — I10 Essential (primary) hypertension: Secondary | ICD-10-CM | POA: Diagnosis not present

## 2023-01-15 DIAGNOSIS — K219 Gastro-esophageal reflux disease without esophagitis: Secondary | ICD-10-CM | POA: Diagnosis not present

## 2023-01-15 DIAGNOSIS — I472 Ventricular tachycardia, unspecified: Secondary | ICD-10-CM

## 2023-01-15 DIAGNOSIS — I340 Nonrheumatic mitral (valve) insufficiency: Secondary | ICD-10-CM

## 2023-01-15 DIAGNOSIS — I480 Paroxysmal atrial fibrillation: Secondary | ICD-10-CM | POA: Diagnosis not present

## 2023-01-15 NOTE — Patient Instructions (Signed)
Medication Instructions:  Your physician recommends that you continue on your current medications as directed. Please refer to the Current Medication list given to you today.  *If you need a refill on your cardiac medications before your next appointment, please call your pharmacy*   Lab Work: None  If you have labs (blood work) drawn today and your tests are completely normal, you will receive your results only by: MyChart Message (if you have MyChart) OR A paper copy in the mail If you have any lab test that is abnormal or we need to change your treatment, we will call you to review the results.   Testing/Procedures: None   Follow-Up: At [facility], you and your health needs are our priority.  As part of our continuing mission to provide you with exceptional heart care, we have created designated Provider Care Teams.  These Care Teams include your primary Cardiologist (physician) and Advanced Practice Providers (APPs -  Physician Assistants and Nurse Practitioners) who all work together to provide you with the care you need, when you need it.  We recommend signing up for the patient portal called "MyChart".  Sign up information is provided on this After Visit Summary.  MyChart is used to connect with patients for Virtual Visits (Telemedicine).  Patients are able to view lab/test results, encounter notes, upcoming appointments, etc.  Non-urgent messages can be sent to your provider as well.   To learn more about what you can do with MyChart, go to [email or web address].    Your next appointment:   6 month(s)  Provider:   Tonny Bollman, MD  or Tereso Newcomer, PA-C

## 2023-01-15 NOTE — Assessment & Plan Note (Signed)
Mild by echocardiogram October 2023.

## 2023-01-15 NOTE — Assessment & Plan Note (Signed)
Maintaining sinus rhythm by exam.  She has not had any recurrent palpitations.  Continue Cardizem CD 360 mg daily, Toprol-XL 200 mg daily, propranolol 10 mg as needed for palpitations.  Her creatinine clearance has remained above 50.  Continue Xarelto 20 mg daily.  Hemoglobin in March 2024 was normal at 13.2, creatinine was normal in February 2024 at 1.04.  Follow-up in 6 months.

## 2023-01-15 NOTE — Assessment & Plan Note (Signed)
Blood pressure is much better controlled.  Continue chlorthalidone 12.5 mg daily, Cardizem CD 360 mg daily, hydralazine 100 mg 3 times a day, Toprol-XL 200 mg daily.

## 2023-01-15 NOTE — Assessment & Plan Note (Signed)
RVOT ventricular tachycardia. MRI in the past w/o evidence of ARVC. She has not had syncope. Continue Metoprolol succinate 200 mg once daily, Diltiazem 360 mg once daily. 

## 2023-01-15 NOTE — Assessment & Plan Note (Signed)
She has had more significant acid reflux recently associated with meals.  She asked if she could take Prilosec OTC.  I advised her to take Prilosec OTC for no more than 2 weeks.  I have also asked her to follow-up with primary care for further management.

## 2023-01-15 NOTE — Progress Notes (Signed)
Cardiology Office Note:    Date:  01/15/2023  ID:  Darria Corvera, DOB 1956/04/13, MRN [id number] PCP: Clovis Riley, Elbert Ewings.August Saucer, MD  [facility] Providers Cardiologist:  Tonny Bollman, MD       Patient Profile:      Atrial fibrillation: Paroxysmal.   Admitted 6/13 with afib/RVR, back to NSR on diltiazem.   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  Noted during ETT in 7/13.   Cardiac MRI (7/13): EF 74%, normal RV size and systolic function with no evidence for ARVC, mild MR, no delayed enhancement.   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  Event Monitor 04/2019: < 1% PVC, PAC burden; 1 run SVT (11 beats) Echo 04/27/20: EF 60-65, no RWMA, Gr 1 D, normal RVSF, RVSP 33.7, trivial MR, ascending aorta 37 mm  TTE 04/23/2022: EF 55-60, no RWMA, normal RVSF, normal PASP, mild LAE, mild MR, RAP 3 Monitor 04/2022: NSR, Avg 62, short SVT runs (longest 17 beats), no sustained arrhythmias, no A-fib or flutter Mild mitral regurgitation Sleep apnea Diabetes mellitus  Hypertension  Hypothyroidism Hyperlipidemia        History of Present Illness:   Alison Porter is a 67 y.o. female who returns for follow-up of atrial fibrillation, hypertension.  She was last seen in April 2024.  I adjusted her hydralazine secondary to uncontrolled blood pressure.  She has been prescribed as needed propranolol for recurrent palpitations.  She is here alone.  She is doing well without chest pain, shortness of breath, syncope, palpitations, leg edema.  Review of Systems  Gastrointestinal:  Positive for heartburn.   See HPI    Studies Reviewed:       Risk Assessment/Calculations:   CHA2DS2-VASc Score = 4   This indicates a 4.8% annual risk of stroke. The patient's score is based upon: CHF History: 0 HTN History: 1 Diabetes History: 1 Stroke History: 0 Vascular Disease History: 0 Age Score: 1 Gender  Score: 1            Physical Exam:   VS:  BP 120/60   Pulse (!) 56   Ht 5\' 7"  (1.702 m)   Wt 182 lb 12.8 oz (82.9 kg)   LMP 11/10/2012   SpO2 96%   BMI 28.63 kg/m    Wt Readings from Last 3 Encounters:  01/15/23 182 lb 12.8 oz (82.9 kg)  10/15/22 184 lb (83.5 kg)  04/10/22 182 lb (82.6 kg)    Constitutional:      Appearance: Healthy appearance. Not in distress.  Neck:     Vascular: JVD normal.  Pulmonary:     Breath sounds: Normal breath sounds. No wheezing. No rales.  Cardiovascular:     Normal rate. Regular rhythm.     Murmurs: There is no murmur.  Edema:    Peripheral edema absent.  Abdominal:     Palpations: Abdomen is soft.     Tenderness: There is no abdominal tenderness.      ASSESSMENT AND PLAN:   HTN (hypertension) Blood pressure is much better controlled.  Continue chlorthalidone 12.5 mg daily, Cardizem CD 360 mg daily, hydralazine 100 mg 3 times a day, Toprol-XL 200 mg daily.  PAF (paroxysmal atrial fibrillation) (HCC) Maintaining sinus rhythm by exam.  She has not had any recurrent palpitations.  Continue Cardizem CD 360 mg daily, Toprol-XL 200 mg daily, propranolol 10 mg as needed for palpitations.  Her creatinine clearance has remained above  50.  Continue Xarelto 20 mg daily.  Hemoglobin in March 2024 was normal at 13.2, creatinine was normal in February 2024 at 1.04.  Follow-up in 6 months.  Mitral regurgitation Mild by echocardiogram October 2023.  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 MRI in the past w/o evidence of ARVC. She has not had syncope. Continue Metoprolol succinate 200 mg once daily, Diltiazem 360 mg once daily.  GERD (gastroesophageal reflux disease) She has had more significant acid reflux recently associated with meals.  She asked if she could take Prilosec OTC.  I advised her to take Prilosec OTC for no more than 2 weeks.  I have also asked her to follow-up with primary care for further management.    Dispo:   Return in about 6 months (around 07/18/2023) for Routine Follow Up, w/ Dr. Excell Seltzer, or Tereso Newcomer, PA-C.  Signed, Tereso Newcomer, PA-C

## 2023-04-02 DIAGNOSIS — R109 Unspecified abdominal pain: Secondary | ICD-10-CM | POA: Diagnosis not present

## 2023-04-02 DIAGNOSIS — Z794 Long term (current) use of insulin: Secondary | ICD-10-CM | POA: Diagnosis not present

## 2023-04-02 DIAGNOSIS — E039 Hypothyroidism, unspecified: Secondary | ICD-10-CM | POA: Diagnosis not present

## 2023-04-02 DIAGNOSIS — F411 Generalized anxiety disorder: Secondary | ICD-10-CM | POA: Diagnosis not present

## 2023-04-02 DIAGNOSIS — I10 Essential (primary) hypertension: Secondary | ICD-10-CM | POA: Diagnosis not present

## 2023-04-02 DIAGNOSIS — I480 Paroxysmal atrial fibrillation: Secondary | ICD-10-CM | POA: Diagnosis not present

## 2023-04-02 DIAGNOSIS — E7800 Pure hypercholesterolemia, unspecified: Secondary | ICD-10-CM | POA: Diagnosis not present

## 2023-04-02 DIAGNOSIS — E119 Type 2 diabetes mellitus without complications: Secondary | ICD-10-CM | POA: Diagnosis not present

## 2023-05-23 ENCOUNTER — Other Ambulatory Visit: Payer: Self-pay | Admitting: Family Medicine

## 2023-05-23 DIAGNOSIS — Z1231 Encounter for screening mammogram for malignant neoplasm of breast: Secondary | ICD-10-CM

## 2023-06-20 DIAGNOSIS — R109 Unspecified abdominal pain: Secondary | ICD-10-CM | POA: Diagnosis not present

## 2023-06-20 DIAGNOSIS — R300 Dysuria: Secondary | ICD-10-CM | POA: Diagnosis not present

## 2023-06-26 ENCOUNTER — Ambulatory Visit
Admission: RE | Admit: 2023-06-26 | Discharge: 2023-06-26 | Disposition: A | Discharge status: Home / Self Care | Payer: Medicare PPO | Source: Ambulatory Visit | Attending: Family Medicine | Admitting: Family Medicine

## 2023-06-26 DIAGNOSIS — Z1231 Encounter for screening mammogram for malignant neoplasm of breast: Secondary | ICD-10-CM | POA: Diagnosis not present

## 2023-07-16 ENCOUNTER — Ambulatory Visit: Payer: Medicare PPO | Attending: Cardiovascular Disease | Admitting: Cardiovascular Disease

## 2023-07-16 ENCOUNTER — Encounter: Payer: Self-pay | Admitting: Cardiovascular Disease

## 2023-07-16 VITALS — BP 188/78 | HR 62 | Ht 68.0 in | Wt 182.6 lb

## 2023-07-16 DIAGNOSIS — N760 Acute vaginitis: Secondary | ICD-10-CM | POA: Diagnosis not present

## 2023-07-16 DIAGNOSIS — I10 Essential (primary) hypertension: Secondary | ICD-10-CM

## 2023-07-16 DIAGNOSIS — I480 Paroxysmal atrial fibrillation: Secondary | ICD-10-CM

## 2023-07-16 DIAGNOSIS — I472 Ventricular tachycardia, unspecified: Secondary | ICD-10-CM | POA: Diagnosis not present

## 2023-07-16 DIAGNOSIS — I340 Nonrheumatic mitral (valve) insufficiency: Secondary | ICD-10-CM

## 2023-07-16 DIAGNOSIS — R300 Dysuria: Secondary | ICD-10-CM | POA: Diagnosis not present

## 2023-07-16 NOTE — Assessment & Plan Note (Signed)
 Anticoagulated with rivaroxaban.  Continue diltiazem and metoprolol succinate.

## 2023-07-16 NOTE — Assessment & Plan Note (Signed)
 On my repeat blood pressure checks today I got a measurement of 190/90 mmHg in the left arm and 188/78 mmHg in the right arm.  She has a component of whitecoat hypertension.  She did not take her p.m. medicines last night because her blood pressure was running low.  I advised her to continue on her current medicines, we discussed lifestyle modification with initiation of a walking program to help with her stress and exercise, and I gave her parameters around holding her antihypertensives.  Advised her to take her hypertensive medicines as prescribed unless her systolic blood pressure is less than 100 mmHg.

## 2023-07-16 NOTE — Assessment & Plan Note (Signed)
 Patient previously found to have RVOT VT.  Managed with high-dose calcium channel blocker and beta-blocker.  No evidence of ARVC by cardiac MRI.  Continue current management.

## 2023-07-16 NOTE — Patient Instructions (Signed)
 Follow-Up: At [facility], you and your health needs are our priority.  As part of our continuing mission to provide you with exceptional heart care, we have created designated Provider Care Teams.  These Care Teams include your primary Cardiologist (physician) and Advanced Practice Providers (APPs -  Physician Assistants and Nurse Practitioners) who all work together to provide you with the care you need, when you need it.  We recommend signing up for the patient portal called MyChart.  Sign up information is provided on this After Visit Summary.  MyChart is used to connect with patients for Virtual Visits (Telemedicine).  Patients are able to view lab/test results, encounter notes, upcoming appointments, etc.  Non-urgent messages can be sent to your provider as well.   To learn more about what you can do with MyChart, go to [email or web address].    Your next appointment:   APP in 6 months, 1 year(s)  Provider:   Ozell Fell, MD

## 2023-07-16 NOTE — Progress Notes (Signed)
 Cardiology Office Note:    Date:  07/16/2023   ID:  Alison, Porter [birth year], MRN [id number]  PCP:  Leonel Cole, MD   [facility] Providers Cardiologist:  Ozell Fell, MD     Referring MD: Merilee CROME.Addie, MD   Chief Complaint  Patient presents with   Follow-up   Atrial Fibrillation    History of Present Illness:    Alison Porter is a 68 y.o. female with a hx of:  Atrial fibrillation: Paroxysmal.   Admitted 6/13 with afib/RVR, back to NSR on diltiazem .   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  Noted during ETT in 7/13.   Cardiac MRI (7/13): EF 74%, normal RV size and systolic function with no evidence for ARVC, mild MR, no delayed enhancement.   ETT-myoview  (7/13): 3' exercise, RVOT VT with rate 160s (LBBB/inferior axis) during exercise, EF 78%, no ischemia or infarction.  Event Monitor 04/2019: < 1% PVC, PAC burden; 1 run SVT (11 beats) Echo 04/27/20: EF 60-65, no RWMA, Gr 1 D, normal RVSF, RVSP 33.7, trivial MR, ascending aorta 37 mm  TTE 04/23/2022: EF 55-60, no RWMA, normal RVSF, normal PASP, mild LAE, mild MR, RAP 3 Monitor 04/2022: NSR, Avg 62, short SVT runs (longest 17 beats), no sustained arrhythmias, no A-fib or flutter Mild mitral regurgitation Sleep apnea Diabetes mellitus  Hypertension  Hypothyroidism Hyperlipidemia    The patient is here alone today. She has had some recent problems with a UTI and feels like she is still recovering from that. She is having some erratic BP readings. At times, she is having low BP's at night. Last night her BP was 112/57 mmHg and she didn't take her PM medications. She has some dizziness at times but no other symptoms. She denies chest pain, shortness of breath, or palpitations/heart racing. No syncope.  She admits that she has been under a lot of stress at home.  Her ex-husband has been sick with cancer and she is helping take care of him.  States that her blood pressure tends to run much  higher when she is under stress.  Current Medications: Current Meds  Medication Sig   ALPRAZolam (XANAX) 0.25 MG tablet Take 0.25 mg by mouth 3 (three) times daily as needed for anxiety.    chlorthalidone  (HYGROTON ) 25 MG tablet Take 1/2 (one-half) tablet by mouth once daily   diltiazem  (CARDIZEM  CD) 360 MG 24 hr capsule TAKE ONE CAPSULE BY MOUTH ONCE DAILY   famotidine  (PEPCID ) 20 MG tablet Take 20 mg by mouth as needed for indigestion or heartburn.   ferrous sulfate 325 (65 FE) MG tablet Take 325 mg by mouth daily with breakfast.   hydrALAZINE  (APRESOLINE ) 100 MG tablet Take 1 tablet (100 mg total) by mouth 3 (three) times daily.   levothyroxine  (SYNTHROID , LEVOTHROID) 50 MCG tablet Take 50 mcg by mouth daily.   metFORMIN (GLUCOPHAGE) 500 MG tablet Take 500 mg by mouth 2 (two) times daily with a meal.    metoprolol  (TOPROL -XL) 200 MG 24 hr tablet Take 200 mg by mouth daily.   NOVOLOG  MIX 70/30 FLEXPEN (70-30) 100 UNIT/ML Pen Patient inject  35 units in the morning and 25 units in the evening at bedtime   omega-3 acid ethyl esters (LOVAZA ) 1 G capsule Take 1 g by mouth daily.   potassium chloride  SA (KLOR-CON  M) 20 MEQ tablet Take 1 tablet (20 mEq total) by mouth daily.   pravastatin  (PRAVACHOL ) 40 MG tablet Take 40 mg by mouth daily.  PROCTO-MED HC 2.5 % rectal cream Apply topically as needed.   propranolol  (INDERAL ) 10 MG tablet Take 1 tablet (10 mg total) by mouth daily as needed (for significant palpitations).   TRUE METRIX BLOOD GLUCOSE TEST test strip SMARTSIG:Via Meter   TRUEplus Lancets 30G MISC    XARELTO  20 MG TABS tablet TAKE ONE TABLET BY MOUTH ONCE DAILY     Allergies:   Codeine phosphate and Codeine   ROS:   Please see the history of present illness.    All other systems reviewed and are negative.  EKGs/Labs/Other Studies Reviewed:    The following studies were reviewed today: Cardiac Studies & Procedures      ECHOCARDIOGRAM  ECHOCARDIOGRAM COMPLETE  04/23/2022  Narrative ECHOCARDIOGRAM REPORT    Patient Name:   Alison Porter Date of Exam: 04/23/2022 Medical Rec #:  [id number]          Height:       68.0 in Accession #:    [id number]         Weight:       182.0 lb Date of Birth:  06-28-1956          BSA:          1.963 m Patient Age:    66 years           BP:           181/91 mmHg Patient Gender: F                  HR:           69 bpm. Exam Location:  [address]  Procedure: 2D Echo, 3D Echo, Cardiac Doppler and Color Doppler  Indications:    R01.1 Murmur  History:        Patient has prior history of Echocardiogram examinations, most recent 04/27/2020. Arrythmias:Atrial Fibrillation and Ventricular Tachycardia; Risk Factors:Hypertension.  Sonographer:    Waldo Guadalajara RCS Referring Phys: [id number] SCOTT T WEAVER  IMPRESSIONS   1. Left ventricular ejection fraction, by estimation, is 55 to 60%. The left ventricle has normal function. The left ventricle has no regional wall motion abnormalities. Left ventricular diastolic parameters were normal. 2. Right ventricular systolic function is normal. The right ventricular size is normal. There is normal pulmonary artery systolic pressure. 3. Left atrial size was mildly dilated. 4. The mitral valve is abnormal. Mild mitral valve regurgitation. No evidence of mitral stenosis. 5. The aortic valve is tricuspid. Aortic valve regurgitation is not visualized. No aortic stenosis is present. 6. The inferior vena cava is normal in size with greater than 50% respiratory variability, suggesting right atrial pressure of 3 mmHg.  FINDINGS Left Ventricle: Left ventricular ejection fraction, by estimation, is 55 to 60%. The left ventricle has normal function. The left ventricle has no regional wall motion abnormalities. The left ventricular internal cavity size was normal in size. There is no left ventricular hypertrophy. Left ventricular diastolic parameters were normal.  Right Ventricle: The  right ventricular size is normal. No increase in right ventricular wall thickness. Right ventricular systolic function is normal. There is normal pulmonary artery systolic pressure. The tricuspid regurgitant velocity is 2.36 m/s, and with an assumed right atrial pressure of 3 mmHg, the estimated right ventricular systolic pressure is 25.3 mmHg.  Left Atrium: Left atrial size was mildly dilated.  Right Atrium: Right atrial size was normal in size.  Pericardium: There is no evidence of pericardial effusion.  Mitral Valve: The mitral valve is abnormal.  There is mild thickening of the mitral valve leaflet(s). Mild mitral valve regurgitation. No evidence of mitral valve stenosis.  Tricuspid Valve: The tricuspid valve is normal in structure. Tricuspid valve regurgitation is mild . No evidence of tricuspid stenosis.  Aortic Valve: The aortic valve is tricuspid. Aortic valve regurgitation is not visualized. No aortic stenosis is present.  Pulmonic Valve: The pulmonic valve was normal in structure. Pulmonic valve regurgitation is not visualized. No evidence of pulmonic stenosis.  Aorta: The aortic root is normal in size and structure.  Venous: The inferior vena cava is normal in size with greater than 50% respiratory variability, suggesting right atrial pressure of 3 mmHg.  IAS/Shunts: No atrial level shunt detected by color flow Doppler.   LEFT VENTRICLE PLAX 2D LVIDd:         4.80 cm   Diastology LVIDs:         3.00 cm   LV e' medial:    9.36 cm/s LV PW:         1.30 cm   LV E/e' medial:  9.5 LV IVS:        1.10 cm   LV e' lateral:   9.79 cm/s LVOT diam:     2.00 cm   LV E/e' lateral: 9.1 LV SV:         99 LV SV Index:   50 LVOT Area:     3.14 cm  3D Volume EF: 3D EF:        60 % LV EDV:       148 ml LV ESV:       59 ml LV SV:        90 ml  RIGHT VENTRICLE RV Basal diam:  3.60 cm RV S prime:     11.50 cm/s TAPSE (M-mode): 3.1 cm RVSP:           25.3 mmHg  LEFT ATRIUM              Index        RIGHT ATRIUM           Index LA diam:        4.20 cm 2.14 cm/m   RA Pressure: 3.00 mmHg LA Vol (A2C):   56.3 ml 28.67 ml/m  RA Area:     15.40 cm LA Vol (A4C):   39.9 ml 20.32 ml/m  RA Volume:   45.00 ml  22.92 ml/m LA Biplane Vol: 49.1 ml 25.01 ml/m AORTIC VALVE LVOT Vmax:   133.00 cm/s LVOT Vmean:  82.100 cm/s LVOT VTI:    0.315 m  AORTA Ao Root diam: 2.80 cm Ao Asc diam:  3.50 cm  MITRAL VALVE                TRICUSPID VALVE MV Area (PHT):              TR Peak grad:   22.3 mmHg MV Decel Time:              TR Vmax:        236.00 cm/s MV E velocity: 88.60 cm/s   Estimated RAP:  3.00 mmHg MV A velocity: 118.00 cm/s  RVSP:           25.3 mmHg MV E/A ratio:  0.75 SHUNTS Systemic VTI:  0.32 m Systemic Diam: 2.00 cm  Maude Emmer MD Electronically signed by Maude Emmer MD Signature Date/Time: 04/23/2022/3:51:38 PM    Final   MONITORS  [place] TERM MONITOR (3-14  DAYS) 05/07/2022  Narrative Patch Wear Time:  8 days and 19 hours (2023-10-16T15:26:19-0400 to 2023-10-25T10:44:55-0400)  Patient had a min HR of 44 bpm, max HR of 116 bpm, and avg HR of 62 bpm. Predominant underlying rhythm was Sinus Rhythm. First Degree AV Block was present. 4 Supraventricular Tachycardia runs occurred, the run with the fastest interval lasting 7 beats with a max rate of 116 bpm, the longest lasting 17 beats with an avg rate of 101 bpm. Isolated SVEs were occasional (2.6%, 19916), SVE Couplets were rare (<1.0%, 224), and SVE Triplets were rare (<1.0%, 16). Isolated VEs were rare (<1.0%), VE Couplets were rare (<1.0%), and no VE Triplets were present.  SUMMARY: The basic rhythm is normal sinus with an average HR of 62 bpm, short supraventricular runs are present with the longest 17 beats at a rate of 101 bpm. Rare ventricular ectopics. No sustained arrhythmia. No atrial fibrillation or flutter.   CARDIAC MRI  MR CARDIAC MORPHOLOGY W WO CONTRAST 03/25/2012  Narrative Cardiac  MRI:  Inidication: VT  Protocol:  The patient was scanned on a 1.5 Tesla GE magnet. Functional imaging was done using Fiesta sequences.  2,3 and 4 chamber views were done to assess for RWMA;s.  IIR and IIR with fat sat sequences were done in the axial and sagital views to look for RV dysplasia.  The patient received multihance   After 10 minutes inversion recovery sequences were done to assess for scar tissue. Quantitiative EF was calculated on a Circle work station using modified Simpsons method  Findings:  There was mild LAE. RA and RV were normal in size and function. There were no diverticulae in the RVOT or RV.  There was no fatty infiltration.  The LV was normal in size and function There were no RWMA;s  There was no hyperenhancement.  The quantitative EF was 74% ( EDV 164, ESV 38cc SV 126 cc)  There appeared to be mild MR with no prolapsed.  The AV appeared mildly thickened and trileaflet  The pulmonary and tricuspid vavles appeared normal.  There was no ASD or VSD There was no pericardial effusion  Impression:  1)    No evidence of RV dysplasia Normal RV size and function  2)    Normal LV EF 74% no hyperenhancemet 3)    Mild LAE 4)    Mild MR  Maude Emmer MD [facility]   Original Report Authenticated By: [email or web address]         EKG:   EKG Interpretation Date/Time:  Tuesday July 16 2023 08:53:34 EST Ventricular Rate:  62 PR Interval:  210 QRS Duration:  96 QT Interval:  454 QTC Calculation: 460 R Axis:   -42  Text Interpretation: Sinus rhythm with 1st degree A-V block Left axis deviation When compared with ECG of 22-Jan-2012 10:53, The rhythm has changed and Fusion complexes are no longer Present Vent. rate has decreased BY  92 BPM QRS is now narrow Confirmed by Wonda Sharper [phone]) on 07/16/2023 9:38:53 AM    Recent Labs: No results found for requested labs within last 365 days.  Recent Lipid Panel    Component Value Date/Time   CHOL 175 07/15/2014 1458   TRIG  188.0 (H) 07/15/2014 1458   HDL 28.90 (L) 07/15/2014 1458   CHOLHDL 6 07/15/2014 1458   VLDL 37.6 07/15/2014 1458   LDLCALC 109 (H) 07/15/2014 1458     Risk Assessment/Calculations:    CHA2DS2-VASc Score = 4   This indicates a 4.8% annual risk of  stroke. The patient's score is based upon: CHF History: 0 HTN History: 1 Diabetes History: 1 Stroke History: 0 Vascular Disease History: 0 Age Score: 1 Gender Score: 1     HYPERTENSION CONTROL Vitals:   07/16/23 0857 07/16/23 0939  BP: (!) 170/90 (!) 188/78    The patient's blood pressure is elevated above target today.  In order to address the patient's elevated BP: Blood pressure will be monitored at home to determine if medication changes need to be made.            Physical Exam:    VS:  BP (!) 188/78   Pulse 62   Ht 5' 8 (1.727 m)   Wt 182 lb 9.6 oz (82.8 kg)   LMP 11/10/2012   SpO2 97%   BMI 27.76 kg/m     Wt Readings from Last 3 Encounters:  07/16/23 182 lb 9.6 oz (82.8 kg)  01/15/23 182 lb 12.8 oz (82.9 kg)  10/15/22 184 lb (83.5 kg)     GEN:  Well nourished, well developed in no acute distress HEENT: Normal NECK: No JVD; No carotid bruits LYMPHATICS: No lymphadenopathy CARDIAC: RRR, no murmurs, rubs, gallops RESPIRATORY:  Clear to auscultation without rales, wheezing or rhonchi  ABDOMEN: Soft, non-tender, non-distended MUSCULOSKELETAL:  No edema; No deformity  SKIN: Warm and dry NEUROLOGIC:  Alert and oriented x 3 PSYCHIATRIC:  Normal affect   Assessment & Plan VT (ventricular tachycardia) (HCC) Patient previously found to have RVOT VT.  Managed with high-dose calcium channel blocker and beta-blocker.  No evidence of ARVC by cardiac MRI.  Continue current management. Primary hypertension On my repeat blood pressure checks today I got a measurement of 190/90 mmHg in the left arm and 188/78 mmHg in the right arm.  She has a component of whitecoat hypertension.  She did not take her p.m. medicines  last night because her blood pressure was running low.  I advised her to continue on her current medicines, we discussed lifestyle modification with initiation of a walking program to help with her stress and exercise, and I gave her parameters around holding her antihypertensives.  Advised her to take her hypertensive medicines as prescribed unless her systolic blood pressure is less than 100 mmHg. PAF (paroxysmal atrial fibrillation) (HCC) Anticoagulated with rivaroxaban .  Continue diltiazem  and metoprolol  succinate. Nonrheumatic mitral valve regurgitation Mild mitral regurgitation on most recent echocardiogram from 2023.            Medication Adjustments/Labs and Tests Ordered: Current medicines are reviewed at length with the patient today.  Concerns regarding medicines are outlined above.  Orders Placed This Encounter  Procedures   EKG 12-Lead   No orders of the defined types were placed in this encounter.   Patient Instructions  Follow-Up: At [facility], you and your health needs are our priority.  As part of our continuing mission to provide you with exceptional heart care, we have created designated Provider Care Teams.  These Care Teams include your primary Cardiologist (physician) and Advanced Practice Providers (APPs -  Physician Assistants and Nurse Practitioners) who all work together to provide you with the care you need, when you need it.  We recommend signing up for the patient portal called MyChart.  Sign up information is provided on this After Visit Summary.  MyChart is used to connect with patients for Virtual Visits (Telemedicine).  Patients are able to view lab/test results, encounter notes, upcoming appointments, etc.  Non-urgent messages can be sent to your  provider as well.   To learn more about what you can do with MyChart, go to [email or web address].    Your next appointment:   APP in 6 months, 1 year(s)  Provider:   Ozell Fell, MD          Signed, Ozell Fell, MD  07/16/2023 9:41 AM    [facility]

## 2023-07-16 NOTE — Assessment & Plan Note (Signed)
 Mild mitral regurgitation on most recent echocardiogram from 2023.

## 2023-09-30 DIAGNOSIS — S161XXA Strain of muscle, fascia and tendon at neck level, initial encounter: Secondary | ICD-10-CM | POA: Diagnosis not present

## 2023-09-30 DIAGNOSIS — J3489 Other specified disorders of nose and nasal sinuses: Secondary | ICD-10-CM | POA: Diagnosis not present

## 2023-10-04 DIAGNOSIS — F411 Generalized anxiety disorder: Secondary | ICD-10-CM | POA: Diagnosis not present

## 2023-10-04 DIAGNOSIS — E039 Hypothyroidism, unspecified: Secondary | ICD-10-CM | POA: Diagnosis not present

## 2023-10-04 DIAGNOSIS — Z0000 Encounter for general adult medical examination without abnormal findings: Secondary | ICD-10-CM | POA: Diagnosis not present

## 2023-10-04 DIAGNOSIS — E119 Type 2 diabetes mellitus without complications: Secondary | ICD-10-CM | POA: Diagnosis not present

## 2023-10-04 DIAGNOSIS — I480 Paroxysmal atrial fibrillation: Secondary | ICD-10-CM | POA: Diagnosis not present

## 2023-10-04 DIAGNOSIS — E7800 Pure hypercholesterolemia, unspecified: Secondary | ICD-10-CM | POA: Diagnosis not present

## 2023-10-04 DIAGNOSIS — I10 Essential (primary) hypertension: Secondary | ICD-10-CM | POA: Diagnosis not present

## 2023-10-04 DIAGNOSIS — R011 Cardiac murmur, unspecified: Secondary | ICD-10-CM | POA: Diagnosis not present

## 2023-10-04 DIAGNOSIS — D6869 Other thrombophilia: Secondary | ICD-10-CM | POA: Diagnosis not present

## 2023-10-10 DIAGNOSIS — Z1211 Encounter for screening for malignant neoplasm of colon: Secondary | ICD-10-CM | POA: Diagnosis not present

## 2023-10-25 DIAGNOSIS — R300 Dysuria: Secondary | ICD-10-CM | POA: Diagnosis not present

## 2023-10-25 DIAGNOSIS — R109 Unspecified abdominal pain: Secondary | ICD-10-CM | POA: Diagnosis not present

## 2023-12-19 ENCOUNTER — Encounter: Payer: Self-pay | Admitting: Physician Assistant

## 2024-01-22 NOTE — Progress Notes (Unsigned)
 Cardiology Office Note   Date:  01/24/2024  ID:  Gabi, Mcfate 05-01-1956, MRN [id number] PCP: Leonel Cole, MD  [facility] Providers Cardiologist:  Ozell Fell, MD {   History of Present Illness Alison Porter is a 68 y.o. female with a PMH significant for:   Atrial fibrillation: Paroxysmal.   Admitted 6/13 with afib/RVR, back to [facility] on diltiazem .   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  Noted during ETT in 7/13.   Cardiac MRI (7/13): EF 74%, normal RV size and systolic function with no evidence for ARVC, mild MR, no delayed enhancement.   ETT-myoview  (7/13): 3' exercise, RVOT VT with rate 160s (LBBB/inferior axis) during exercise, EF 78%, no ischemia or infarction.  Event Monitor 04/2019: < 1% PVC, PAC burden; 1 run SVT (11 beats) Echo 04/27/20: EF 60-65, no RWMA, Gr 1 D, normal RVSF, RVSP 33.7, trivial MR, ascending aorta 37 mm  TTE 04/23/2022: EF 55-60, no RWMA, normal RVSF, normal PASP, mild LAE, mild MR, RAP 3 Monitor 04/2022: NSR, Avg 62, short SVT runs (longest 17 beats), no sustained arrhythmias, no A-fib or flutter Mild mitral regurgitation Sleep apnea Diabetes mellitus  Hypertension  Hypothyroidism Hyperlipidemia      She was last seen by Dr. Fell January 2025.  She had some recent problems with a UTI and felt she was still recovering from that.  Also had some erratic BP readings.  Sometimes low BPs at night.  Last night blood pressure was 112/57 mmHg and she did not take her p.m. medications.  Had some dizziness at times but no other symptoms.  Denies chest pain, shortness of breath, palpitations/racing heart.  No syncope.  She admitted that she was under a lot of stress at home.  Her ex-husband has been sick with cancer and she is helping take care of him.  States that her blood pressure tends to run much higher when she is under stress.  Today, she presents with hypertension and atrial fibrillation for cardiovascular  follow-up.  Her blood pressure is erratic, with a recent office reading of 188/78 mmHg, but often lower at home, such as 112/57 mmHg. She takes metoprolol  succinate 200 mg daily, Cardizem  360 mg, and chlorthalidone  12.5 mg. She avoids hydralazine  at night due to concerns about hypotension.  She has atrial fibrillation but no recent episodes or symptoms like lightheadedness or dizziness. Her heart rate is managed with metoprolol  and Cardizem , and she takes Xarelto  for anticoagulation. Her heart rate can drop to the 40s at night and is sometimes in the 50s.  She experiences daytime swelling in her feet, which resolves overnight, and notes puffiness after walking  Reports no shortness of breath nor dyspnea on exertion. Reports no chest pain, pressure, or tightness. No orthopnea, PND. Reports no palpitations.   Discussed the use of AI scribe software for clinical note transcription with the patient, who gave verbal consent to proceed.   ROS: Pertinent ROS in HPI  Studies Reviewed      ECHOCARDIOGRAM   ECHOCARDIOGRAM COMPLETE 04/23/2022   Narrative ECHOCARDIOGRAM REPORT       Patient Name:   Alison Porter Date of Exam: 04/23/2022 Medical Rec #:  [id number]          Height:       68.0 in Accession #:    [id number]         Weight:       182.0 lb Date of Birth:  1956/03/17  BSA:          1.963 m Patient Age:    66 years           BP:           181/91 mmHg Patient Gender: F                  HR:           69 bpm. Exam Location:  [address]   Procedure: 2D Echo, 3D Echo, Cardiac Doppler and Color Doppler   Indications:    R01.1 Murmur   History:        Patient has prior history of Echocardiogram examinations, most recent 04/27/2020. Arrythmias:Atrial Fibrillation and Ventricular Tachycardia; Risk Factors:Hypertension.   Sonographer:    Waldo Guadalajara RCS Referring Phys: [id number] SCOTT T WEAVER   IMPRESSIONS     1. Left ventricular ejection fraction, by estimation, is  55 to 60%. The left ventricle has normal function. The left ventricle has no regional wall motion abnormalities. Left ventricular diastolic parameters were normal. 2. Right ventricular systolic function is normal. The right ventricular size is normal. There is normal pulmonary artery systolic pressure. 3. Left atrial size was mildly dilated. 4. The mitral valve is abnormal. Mild mitral valve regurgitation. No evidence of mitral stenosis. 5. The aortic valve is tricuspid. Aortic valve regurgitation is not visualized. No aortic stenosis is present. 6. The inferior vena cava is normal in size with greater than 50% respiratory variability, suggesting right atrial pressure of 3 mmHg.   FINDINGS Left Ventricle: Left ventricular ejection fraction, by estimation, is 55 to 60%. The left ventricle has normal function. The left ventricle has no regional wall motion abnormalities. The left ventricular internal cavity size was normal in size. There is no left ventricular hypertrophy. Left ventricular diastolic parameters were normal.   Right Ventricle: The right ventricular size is normal. No increase in right ventricular wall thickness. Right ventricular systolic function is normal. There is normal pulmonary artery systolic pressure. The tricuspid regurgitant velocity is 2.36 m/s, and with an assumed right atrial pressure of 3 mmHg, the estimated right ventricular systolic pressure is 25.3 mmHg.   Left Atrium: Left atrial size was mildly dilated.   Right Atrium: Right atrial size was normal in size.   Pericardium: There is no evidence of pericardial effusion.   Mitral Valve: The mitral valve is abnormal. There is mild thickening of the mitral valve leaflet(s). Mild mitral valve regurgitation. No evidence of mitral valve stenosis.   Tricuspid Valve: The tricuspid valve is normal in structure. Tricuspid valve regurgitation is mild . No evidence of tricuspid stenosis.   Aortic Valve: The aortic valve is  tricuspid. Aortic valve regurgitation is not visualized. No aortic stenosis is present.   Pulmonic Valve: The pulmonic valve was normal in structure. Pulmonic valve regurgitation is not visualized. No evidence of pulmonic stenosis.   Aorta: The aortic root is normal in size and structure.   Venous: The inferior vena cava is normal in size with greater than 50% respiratory variability, suggesting right atrial pressure of 3 mmHg.   IAS/Shunts: No atrial level shunt detected by color flow Doppler.     LEFT VENTRICLE PLAX 2D LVIDd:         4.80 cm   Diastology LVIDs:         3.00 cm   LV e' medial:    9.36 cm/s LV PW:         1.30 cm  LV E/e' medial:  9.5 LV IVS:        1.10 cm   LV e' lateral:   9.79 cm/s LVOT diam:     2.00 cm   LV E/e' lateral: 9.1 LV SV:         99 LV SV Index:   50 LVOT Area:     3.14 cm   3D Volume EF: 3D EF:        60 % LV EDV:       148 ml LV ESV:       59 ml LV SV:        90 ml   RIGHT VENTRICLE RV Basal diam:  3.60 cm RV S prime:     11.50 cm/s TAPSE (M-mode): 3.1 cm RVSP:           25.3 mmHg   LEFT ATRIUM             Index        RIGHT ATRIUM           Index Alison diam:        4.20 cm 2.14 cm/m   RA Pressure: 3.00 mmHg Alison Vol (A2C):   56.3 ml 28.67 ml/m  RA Area:     15.40 cm Alison Vol (A4C):   39.9 ml 20.32 ml/m  RA Volume:   45.00 ml  22.92 ml/m Alison Biplane Vol: 49.1 ml 25.01 ml/m AORTIC VALVE LVOT Vmax:   133.00 cm/s LVOT Vmean:  82.100 cm/s LVOT VTI:    0.315 m   AORTA Ao Root diam: 2.80 cm Ao Asc diam:  3.50 cm   MITRAL VALVE                TRICUSPID VALVE MV Area (PHT):              TR Peak grad:   22.3 mmHg MV Decel Time:              TR Vmax:        236.00 cm/s MV E velocity: 88.60 cm/s   Estimated RAP:  3.00 mmHg MV A velocity: 118.00 cm/s  RVSP:           25.3 mmHg MV E/A ratio:  0.75 SHUNTS Systemic VTI:  0.32 m Systemic Diam: 2.00 cm   Maude Emmer MD Electronically signed by Maude Emmer MD Signature Date/Time:  04/23/2022/3:51:38 PM       Final   MONITORS   Flammia TERM MONITOR (3-14 DAYS) 05/07/2022   Narrative Patch Wear Time:  8 days and 19 hours (2023-10-16T15:26:19-0400 to 2023-10-25T10:44:55-0400)   Patient had a min HR of 44 bpm, max HR of 116 bpm, and avg HR of 62 bpm. Predominant underlying rhythm was Sinus Rhythm. First Degree AV Block was present. 4 Supraventricular Tachycardia runs occurred, the run with the fastest interval lasting 7 beats with a max rate of 116 bpm, the longest lasting 17 beats with an avg rate of 101 bpm. Isolated SVEs were occasional (2.6%, 19916), SVE Couplets were rare (<1.0%, 224), and SVE Triplets were rare (<1.0%, 16). Isolated VEs were rare (<1.0%), VE Couplets were rare (<1.0%), and no VE Triplets were present.   SUMMARY: The basic rhythm is normal sinus with an average HR of 62 bpm, short supraventricular runs are present with the longest 17 beats at a rate of 101 bpm. Rare ventricular ectopics. No sustained arrhythmia. No atrial fibrillation or flutter.   CARDIAC MRI   MR CARDIAC MORPHOLOGY  W WO CONTRAST 03/25/2012   Narrative Cardiac MRI:   Inidication: VT   Protocol:  The patient was scanned on a 1.5 Tesla GE magnet. Functional imaging was done using Fiesta sequences.  2,3 and 4 chamber views were done to assess for RWMA;s.  IIR and IIR with fat sat sequences were done in the axial and sagital views to look for RV dysplasia.  The patient received multihance   After 10 minutes inversion recovery sequences were done to assess for scar tissue. Quantitiative EF was calculated on a Circle work station using modified Simpsons method   Findings:   There was mild LAE. RA and RV were normal in size and function. There were no diverticulae in the RVOT or RV.  There was no fatty infiltration.  The LV was normal in size and function There were no RWMA;s  There was no hyperenhancement.  The quantitative EF was 74% ( EDV 164, ESV 38cc SV 126 cc)  There  appeared to be mild MR with no prolapsed.  The AV appeared mildly thickened and trileaflet  The pulmonary and tricuspid vavles appeared normal.  There was no ASD or VSD There was no pericardial effusion   Impression:   1)    No evidence of RV dysplasia Normal RV size and function   2)    Normal LV EF 74% no hyperenhancemet 3)    Mild LAE 4)    Mild MR   Maude Emmer MD [facility]     Original Report Authenticated By: LAURINA Risk Assessment/Calculations  CHA2DS2-VASc Score = 4  This indicates a 4.8% annual risk of stroke. The patient's score is based upon: CHF History: 0 HTN History: 1 Diabetes History: 1 Stroke History: 0 Vascular Disease History: 0 Age Score: 1 Gender Score: 1       Physical Exam VS:  BP (!) 140/82 (BP Location: Left Arm, Patient Position: Sitting, Cuff Size: Large)   Pulse 68   Ht 5' 8 (1.727 m)   Wt 183 lb (83 kg)   LMP 11/10/2012   SpO2 98%   BMI 27.83 kg/m        Wt Readings from Last 3 Encounters:  01/24/24 183 lb (83 kg)  07/16/23 182 lb 9.6 oz (82.8 kg)  01/15/23 182 lb 12.8 oz (82.9 kg)    GEN: Well nourished, well developed in no acute distress NECK: No JVD; No carotid bruits CARDIAC: RRR, + systolic murmur, rubs, gallops RESPIRATORY:  Clear to auscultation without rales, wheezing or rhonchi  ABDOMEN: Soft, non-tender, non-distended EXTREMITIES:  No edema; No deformity   ASSESSMENT AND PLAN  Hypertension Blood pressure erratic; suspected white coat syndrome. Home monitoring shows good control. Hydralazine  not taken due to low nighttime BP concerns. - Continue metoprolol , Cardizem , and chlorthalidone . - Refill hydralazine ; adjust intake based on BP readings. - Monitor BP at home regularly. - Order CBC to monitor blood counts due to Xarelto  use.  Heart murmur Roen-standing mitral valve murmur with mild leak. No new symptoms. - Schedule echocardiogram in 2026 unless symptoms develop.  Bradycardia/PAF Heart rate drops to 49 bpm  at night, increases with activity. No concerning symptoms. No Afib  - Monitor heart rate and report symptoms such as lightheadedness, dizziness, or extreme fatigue. - Adjust medications if symptoms develop.  Venous insufficiency Daytime foot swelling resolves overnight, likely venous insufficiency. - Consider wearing compression hose during winter months. - Monitor for worsening or persistent swelling.  Type 2 diabetes mellitus Morning blood sugars slightly elevated; insulin  regimen effective.  A1c slightly above target but acceptable. - Continue current insulin  regimen of 30 units in the morning and 25 units in the evening. - Monitor blood sugar levels regularly.    Dispo: She can follow-up in 1 year with Dr. Wonda  Signed, Orren LOISE Fabry, PA-C

## 2024-01-24 ENCOUNTER — Ambulatory Visit: Attending: Physician Assistant | Admitting: Physician Assistant

## 2024-01-24 ENCOUNTER — Encounter: Payer: Self-pay | Admitting: Physician Assistant

## 2024-01-24 VITALS — BP 140/82 | HR 68 | Ht 68.0 in | Wt 183.0 lb

## 2024-01-24 DIAGNOSIS — I340 Nonrheumatic mitral (valve) insufficiency: Secondary | ICD-10-CM

## 2024-01-24 DIAGNOSIS — I480 Paroxysmal atrial fibrillation: Secondary | ICD-10-CM

## 2024-01-24 DIAGNOSIS — R011 Cardiac murmur, unspecified: Secondary | ICD-10-CM

## 2024-01-24 DIAGNOSIS — I472 Ventricular tachycardia, unspecified: Secondary | ICD-10-CM | POA: Diagnosis not present

## 2024-01-24 DIAGNOSIS — I10 Essential (primary) hypertension: Secondary | ICD-10-CM | POA: Diagnosis not present

## 2024-01-24 MED ORDER — RIVAROXABAN 20 MG PO TABS: 20.0000 mg | ORAL_TABLET | Freq: Every day | ORAL | 11 refills | Status: AC

## 2024-01-24 MED ORDER — PROPRANOLOL HCL 10 MG PO TABS: 10.0000 mg | ORAL_TABLET | Freq: Every day | ORAL | 3 refills | Status: AC | PRN

## 2024-01-24 MED ORDER — METOPROLOL SUCCINATE ER 200 MG PO TB24: 200.0000 mg | ORAL_TABLET | Freq: Every day | ORAL | 3 refills | Status: AC

## 2024-01-24 MED ORDER — HYDRALAZINE HCL 100 MG PO TABS: 100.0000 mg | ORAL_TABLET | Freq: Three times a day (TID) | ORAL | 3 refills | Status: AC

## 2024-01-24 MED ORDER — CHLORTHALIDONE 25 MG PO TABS: ORAL_TABLET | ORAL | 2 refills | Status: DC

## 2024-01-24 MED ORDER — OMEGA-3-ACID ETHYL ESTERS 1 G PO CAPS: 1.0000 g | ORAL_CAPSULE | Freq: Every day | ORAL | 3 refills | Status: AC

## 2024-01-24 MED ORDER — POTASSIUM CHLORIDE CRYS ER 20 MEQ PO TBCR: 20.0000 meq | EXTENDED_RELEASE_TABLET | Freq: Every day | ORAL | 3 refills | Status: AC

## 2024-01-24 MED ORDER — PRAVASTATIN SODIUM 40 MG PO TABS
40.0000 mg | ORAL_TABLET | Freq: Every day | ORAL | 3 refills | Status: AC
Start: 2024-01-24 — End: ?

## 2024-01-24 MED ORDER — DILTIAZEM HCL ER COATED BEADS 360 MG PO CP24: 360.0000 mg | ORAL_CAPSULE | Freq: Every day | ORAL | 4 refills | Status: AC

## 2024-01-24 NOTE — Patient Instructions (Addendum)
 Medication Instructions:  Your medications have been sent to your pharmacy.  Monitor your heart rate and blood pressure daily.  *If you need a refill on your cardiac medications before your next appointment, please call your pharmacy*   Lab Work: Labs will be drawn today................ CBC If you have labs (blood work) drawn today and your tests are completely normal, you will receive your results only by: MyChart Message (if you have MyChart) OR A paper copy in the mail If you have any lab test that is abnormal or we need to change your treatment, we will call you to review the results.   Testing/Procedures: FYI: Your physician has requested that you have an echocardiogram next year. Echocardiography is a painless test that uses sound waves to create images of your heart. It provides your doctor with information about the size and shape of your heart and how well your heart's chambers and valves are working. This procedure takes approximately one hour. There are no restrictions for this procedure. Please do NOT wear cologne, perfume, aftershave, or lotions (deodorant is allowed). Please arrive 15 minutes prior to your appointment time.  Please note: We ask at that you not bring children with you during ultrasound (echo/ vascular) testing. Due to room size and safety concerns, children are not allowed in the ultrasound rooms during exams. Our front office staff cannot provide observation of children in our lobby area while testing is being conducted. An adult accompanying a patient to their appointment will only be allowed in the ultrasound room at the discretion of the ultrasound technician under special circumstances. We apologize for any inconvenience.    Follow-Up: At [facility], you and your health needs are our priority.  As part of our continuing mission to provide you with exceptional heart care, we have created designated Provider Care Teams.  These Care Teams include  your primary Cardiologist (physician) and Advanced Practice Providers (APPs -  Physician Assistants and Nurse Practitioners) who all work together to provide you with the care you need, when you need it.  We recommend signing up for the patient portal called MyChart.  Sign up information is provided on this After Visit Summary.  MyChart is used to connect with patients for Virtual Visits (Telemedicine).  Patients are able to view lab/test results, encounter notes, upcoming appointments, etc.  Non-urgent messages can be sent to your provider as well.   To learn more about what you can do with MyChart, go to [email or web address].    Your next appointment:   1 year(s)  Provider:   Ozell Fell, MD    Other Instructions Thank you for choosing [facility]!   BLOOD PRESSURE READINGS TAKE YOUR BLOOD PRESSURE AT LEAST 1 HOUR AFTER TAKING YOUR MEDICATION BRING LOG WITH YOU TO YOUR FOLLOW UP APPOINTMENT FOR REVIEW   DATE TIME BP HR COMMENT DATE  TIME  BP HR COMMENT  DATE TIME BP HR COMMENT DATE  TIME  BP HR COMMENT

## 2024-01-25 LAB — CBC
Hematocrit: 44 % (ref 34.0–46.6)
Hemoglobin: 13.9 g/dL (ref 11.1–15.9)
MCH: 27.9 pg (ref 26.6–33.0)
MCHC: 31.6 g/dL (ref 31.5–35.7)
MCV: 88 fL (ref 79–97)
Platelets: 278 x10E3/uL (ref 150–450)
RBC: 4.99 x10E6/uL (ref 3.77–5.28)
RDW: 13.8 % (ref 11.7–15.4)
WBC: 4.8 x10E3/uL (ref 3.4–10.8)

## 2024-01-27 ENCOUNTER — Ambulatory Visit: Payer: Self-pay | Admitting: *Deleted

## 2024-03-13 DIAGNOSIS — Z0000 Encounter for general adult medical examination without abnormal findings: Secondary | ICD-10-CM | POA: Diagnosis not present

## 2024-03-13 DIAGNOSIS — E559 Vitamin D deficiency, unspecified: Secondary | ICD-10-CM | POA: Diagnosis not present

## 2024-03-13 DIAGNOSIS — I10 Essential (primary) hypertension: Secondary | ICD-10-CM | POA: Diagnosis not present

## 2024-03-13 DIAGNOSIS — Z1159 Encounter for screening for other viral diseases: Secondary | ICD-10-CM | POA: Diagnosis not present

## 2024-03-13 DIAGNOSIS — M542 Cervicalgia: Secondary | ICD-10-CM | POA: Diagnosis not present

## 2024-03-13 DIAGNOSIS — R600 Localized edema: Secondary | ICD-10-CM | POA: Diagnosis not present

## 2024-03-13 DIAGNOSIS — F411 Generalized anxiety disorder: Secondary | ICD-10-CM | POA: Diagnosis not present

## 2024-03-13 DIAGNOSIS — I4891 Unspecified atrial fibrillation: Secondary | ICD-10-CM | POA: Diagnosis not present

## 2024-03-13 DIAGNOSIS — E1165 Type 2 diabetes mellitus with hyperglycemia: Secondary | ICD-10-CM | POA: Diagnosis not present

## 2024-03-13 DIAGNOSIS — Z79899 Other long term (current) drug therapy: Secondary | ICD-10-CM | POA: Diagnosis not present

## 2024-03-30 ENCOUNTER — Telehealth: Payer: Self-pay | Admitting: Cardiovascular Disease

## 2024-03-30 MED ORDER — CHLORTHALIDONE 25 MG PO TABS: ORAL_TABLET | ORAL | 2 refills | Status: AC

## 2024-03-30 NOTE — Telephone Encounter (Signed)
*  STAT* If patient is at the pharmacy, call can be transferred to refill team.   1. Which medications need to be refilled? (please list name of each medication and dose if known) chlorthalidone  (HYGROTON ) 25 MG tablet    2. Would you like to learn more about the convenience, safety, & potential cost savings by using the [facility]?No    3. Are you open to using the [facility] (Type [facility]. No   4. Which pharmacy/location (including street and city if local pharmacy) is medication to be sent to?[id number] - [place], [place] - [address]    5. Do they need a 30 day or 90 day supply? 90 day

## 2024-04-03 DIAGNOSIS — Z0001 Encounter for general adult medical examination with abnormal findings: Secondary | ICD-10-CM | POA: Diagnosis not present

## 2024-04-03 DIAGNOSIS — F411 Generalized anxiety disorder: Secondary | ICD-10-CM | POA: Diagnosis not present

## 2024-04-03 DIAGNOSIS — Z6828 Body mass index (BMI) 28.0-28.9, adult: Secondary | ICD-10-CM | POA: Diagnosis not present

## 2024-04-03 DIAGNOSIS — E1169 Type 2 diabetes mellitus with other specified complication: Secondary | ICD-10-CM | POA: Diagnosis not present

## 2024-04-03 DIAGNOSIS — E785 Hyperlipidemia, unspecified: Secondary | ICD-10-CM | POA: Diagnosis not present

## 2024-04-03 DIAGNOSIS — I110 Hypertensive heart disease with heart failure: Secondary | ICD-10-CM | POA: Diagnosis not present

## 2024-04-03 DIAGNOSIS — E1165 Type 2 diabetes mellitus with hyperglycemia: Secondary | ICD-10-CM | POA: Diagnosis not present

## 2024-04-03 DIAGNOSIS — E663 Overweight: Secondary | ICD-10-CM | POA: Diagnosis not present

## 2024-04-03 DIAGNOSIS — I509 Heart failure, unspecified: Secondary | ICD-10-CM | POA: Diagnosis not present

## 2024-05-12 ENCOUNTER — Other Ambulatory Visit: Payer: Self-pay | Admitting: Geriatric Medicine

## 2024-05-12 DIAGNOSIS — Z1231 Encounter for screening mammogram for malignant neoplasm of breast: Secondary | ICD-10-CM

## 2024-10-05 ENCOUNTER — Ambulatory Visit: Admitting: Podiatry
